# Patient Record
Sex: Female | Born: 1990 | Race: White | Hispanic: No | Marital: Single | Smoking: Never smoker
Health system: Southern US, Community
[De-identification: ages and names within clinical notes are randomized; demographics above are authoritative.]

## PROBLEM LIST (undated history)

## (undated) DIAGNOSIS — Z789 Other specified health status: Secondary | ICD-10-CM

## (undated) HISTORY — PX: NO PAST SURGERIES: SHX2092

---

## 2013-06-05 ENCOUNTER — Emergency Department (HOSPITAL_COMMUNITY)
Admission: EM | Admit: 2013-06-05 | Discharge: 2013-06-06 | Disposition: A | Discharge status: Home / Self Care | Payer: 59 | Attending: Emergency Medicine | Admitting: Emergency Medicine

## 2013-06-05 ENCOUNTER — Encounter (HOSPITAL_COMMUNITY): Payer: Self-pay | Admitting: Emergency Medicine

## 2013-06-05 DIAGNOSIS — F4320 Adjustment disorder, unspecified: Secondary | ICD-10-CM

## 2013-06-05 DIAGNOSIS — Z79899 Other long term (current) drug therapy: Secondary | ICD-10-CM | POA: Insufficient documentation

## 2013-06-05 NOTE — ED Notes (Signed)
Pt states she is here because her parents are too strict and dont know how to let their daughter grow up.  Parents states the pt is acting out, leaving home to go live with her boyfriend then coming home and leaving again  Father states he thinks she is unstable and is afraid to leave pt alone.  Pt denies SI/HI.  Mother states she thinks the pt has a chemical imbalance and needs to be checked out.  Pt is agitated and upset about being here.

## 2013-06-06 NOTE — ED Provider Notes (Signed)
CSN: [id number]     Arrival date & time 06/05/13  2336 History     First MD Initiated Contact with Patient 06/06/13 0037     Chief Complaint  Patient presents with  . Medical Clearance   (Consider location/radiation/quality/duration/timing/severity/associated sxs/prior Treatment) HPI 22 yo female presents to the ER accompanied by her parents.  Her parents are concerned about her recent behavior and wish she be evaluated for possible chemical imbalance.  Over the last year patient has been dating a new boyfriend that they do not approve of.  She moved in with him two months ago, recently came back.  They do not wish her to continue to associate with him.  They feel she is no longer herself.  Pt was sad this past weekend (per mother) because the patient saw her boyfriend posting on facebook about going out.  Pt denies SI/HI, depression, delusions, psychosis.  She is upset that her parents are so strict and controlling her life.    History reviewed. No pertinent past medical history. History reviewed. No pertinent past surgical history. Family History  Problem Relation Age of Onset  . Hypertension Other   . Stroke Other   . Depression Other    History  Substance Use Topics  . Smoking status: Never Smoker   . Smokeless tobacco: Not on file  . Alcohol Use: No   OB History   Grav Para Term Preterm Abortions TAB SAB Ect Mult Living                 Review of Systems  All other systems reviewed and are negative.    Allergies  Review of patient's allergies indicates no known allergies.  Home Medications   Current Outpatient Rx  Name  Route  Sig  Dispense  Refill  . vitamin C (ASCORBIC ACID) 500 MG tablet   Oral   Take 500 mg by mouth every morning.          BP 135/79  Pulse 99  Temp(Src) 98.3 F (36.8 C) (Oral)  Resp 20  SpO2 99%  LMP 05/20/2013 Physical Exam  Nursing note and vitals reviewed. Constitutional: She is oriented to person, place, and time. She appears  well-developed and well-nourished.  HENT:  Head: Normocephalic and atraumatic.  Nose: Nose normal.  Mouth/Throat: Oropharynx is clear and moist.  Eyes: Conjunctivae and EOM are normal. Pupils are equal, round, and reactive to light.  Neck: Normal range of motion. Neck supple. No JVD present. No tracheal deviation present. No thyromegaly present.  Cardiovascular: Normal rate, regular rhythm, normal heart sounds and intact distal pulses.  Exam reveals no gallop and no friction rub.   No murmur heard. Pulmonary/Chest: Effort normal and breath sounds normal. No stridor. No respiratory distress. She has no wheezes. She has no rales. She exhibits no tenderness.  Abdominal: Soft. Bowel sounds are normal. She exhibits no distension and no mass. There is no tenderness. There is no rebound and no guarding.  Musculoskeletal: Normal range of motion. She exhibits no edema and no tenderness.  Lymphadenopathy:    She has no cervical adenopathy.  Neurological: She is alert and oriented to person, place, and time. She has normal reflexes. No cranial nerve deficit. She exhibits normal muscle tone. Coordination normal.  Skin: Skin is warm and dry. No rash noted. No erythema. No pallor.  Psychiatric: She has a normal mood and affect. Her behavior is normal. Judgment and thought content normal.    ED Course   Procedures (including  critical care time)  Labs Reviewed - No data to display No results found. 1. Adjustment disorder     MDM  22 yo female with possible adjustment disorder vs just going though normal life changed and establishing independence.  Pt and family recommended family counseling.  Pt does not meet criteria for admission.  Olivia Mackie, MD 06/06/13 [phone]

## 2013-10-06 ENCOUNTER — Inpatient Hospital Stay (HOSPITAL_COMMUNITY)
Admission: EM | Admit: 2013-10-06 | Discharge: 2013-10-06 | Disposition: A | Discharge status: Other Acute Inpatient Hospital | Payer: 59 | Attending: Emergency Medicine | Admitting: Emergency Medicine

## 2013-10-06 ENCOUNTER — Encounter (HOSPITAL_COMMUNITY): Payer: Self-pay | Admitting: Emergency Medicine

## 2013-10-06 ENCOUNTER — Emergency Department (HOSPITAL_COMMUNITY): Payer: 59

## 2013-10-06 DIAGNOSIS — N83209 Unspecified ovarian cyst, unspecified side: Secondary | ICD-10-CM | POA: Insufficient documentation

## 2013-10-06 DIAGNOSIS — R109 Unspecified abdominal pain: Secondary | ICD-10-CM

## 2013-10-06 DIAGNOSIS — O009 Unspecified ectopic pregnancy without intrauterine pregnancy: Secondary | ICD-10-CM

## 2013-10-06 DIAGNOSIS — N838 Other noninflammatory disorders of ovary, fallopian tube and broad ligament: Secondary | ICD-10-CM

## 2013-10-06 DIAGNOSIS — O9989 Other specified diseases and conditions complicating pregnancy, childbirth and the puerperium: Secondary | ICD-10-CM | POA: Insufficient documentation

## 2013-10-06 DIAGNOSIS — O26899 Other specified pregnancy related conditions, unspecified trimester: Secondary | ICD-10-CM

## 2013-10-06 DIAGNOSIS — R1031 Right lower quadrant pain: Secondary | ICD-10-CM | POA: Insufficient documentation

## 2013-10-06 LAB — LIPASE, BLOOD: Lipase: 18 U/L (ref 11–59)

## 2013-10-06 LAB — COMPREHENSIVE METABOLIC PANEL
Albumin: 4.1 g/dL (ref 3.5–5.2)
BUN: 11 mg/dL (ref 6–23)
Calcium: 9 mg/dL (ref 8.4–10.5)
GFR calc Af Amer: >90 mL/min (ref >90)
Glucose, Bld: 101 mg/dL — ABNORMAL HIGH (ref 70–99)
Sodium: 134 mEq/L — ABNORMAL LOW (ref 135–145)
Total Protein: 7.1 g/dL (ref 6.0–8.3)

## 2013-10-06 LAB — URINALYSIS, ROUTINE W REFLEX MICROSCOPIC
Glucose, UA: NEGATIVE mg/dL
Ketones, ur: NEGATIVE mg/dL
Leukocytes, UA: NEGATIVE
Protein, ur: NEGATIVE mg/dL
Urobilinogen, UA: 1 mg/dL (ref 0.0–1.0)

## 2013-10-06 LAB — CBC
Hemoglobin: 12.7 g/dL (ref 12.0–15.0)
MCH: 29.3 pg (ref 26.0–34.0)
MCHC: 33.8 g/dL (ref 30.0–36.0)
RDW: 12.5 % (ref 11.5–15.5)

## 2013-10-06 LAB — ABO/RH: ABO/RH(D): O POS

## 2013-10-06 MED ORDER — MORPHINE SULFATE 4 MG/ML IJ SOLN
4.0000 mg | Freq: Once | INTRAMUSCULAR | Status: AC
  Administered 2013-10-06: 4 mg via INTRAVENOUS
  Filled 2013-10-06: qty 1

## 2013-10-06 MED ORDER — SODIUM CHLORIDE 0.9 % IV BOLUS (SEPSIS)
1000.0000 mL | Freq: Once | INTRAVENOUS | Status: AC
  Administered 2013-10-06: 1000 mL via INTRAVENOUS

## 2013-10-06 MED ORDER — ONDANSETRON HCL 4 MG/2ML IJ SOLN
4.0000 mg | Freq: Once | INTRAMUSCULAR | Status: AC
  Administered 2013-10-06: 4 mg via INTRAVENOUS
  Filled 2013-10-06: qty 2

## 2013-10-06 MED ORDER — METHOTREXATE INJECTION FOR WOMEN'S HOSPITAL
50.0000 mg/m2 | Freq: Once | INTRAMUSCULAR | Status: AC
  Administered 2013-10-06: 70 mg via INTRAMUSCULAR
  Filled 2013-10-06: qty 1.4

## 2013-10-06 NOTE — ED Notes (Signed)
Pt from home reports that she started to have RLQ abd pain x2 days. Pt reports that she had "dry heaves" yesterday, but not today. Pt denies any other c/o. Pt is A&O and in NAD. Pt having difficulty standing erect to walk.

## 2013-10-06 NOTE — MAU Note (Signed)
Pt presents to [facility] complaining of right lower quadrant abdominal pain. Complains of some spotting x2 weeks following her normal period.

## 2013-10-06 NOTE — ED Provider Notes (Signed)
CSN: [id number]     Arrival date & time 10/06/13  4098 History   First MD Initiated Contact with Patient 10/06/13 1045     Chief Complaint  Patient presents with  . Abdominal Pain   (Consider location/radiation/quality/duration/timing/severity/associated sxs/prior Treatment) HPI Pt presenting with c/o right sided pelvic pain.  Pt states that pain began yesterday and was relieved after eating food and taking ibuprofen.  This morning she states pain had recurred, described as sharp and constant.  She took ibuprofen this morning again and feels that it is helping.  She feels that when she stands up she is not able to stand up straight but feels better bending forward.  She also describes feeling lightheaded yesterday and having dry heaves.  Has been able to keep down food this morning.  LMP was 2 weeks ago and normal per patient.  No dysuria, no vaginal discharge or vaginal bleeding.  There are no other associated systemic symptoms, there are no other alleviating or modifying factors.   History reviewed. No pertinent past medical history. History reviewed. No pertinent past surgical history. Family History  Problem Relation Age of Onset  . Hypertension Other   . Stroke Other   . Depression Other    History  Substance Use Topics  . Smoking status: Never Smoker   . Smokeless tobacco: Not on file  . Alcohol Use: No   OB History   Grav Para Term Preterm Abortions TAB SAB Ect Mult Living                 Review of Systems ROS reviewed and all otherwise negative except for mentioned in HPI  Allergies  Review of patient's allergies indicates no known allergies.  Home Medications   Current Outpatient Rx  Name  Route  Sig  Dispense  Refill  . ibuprofen (ADVIL,MOTRIN) 200 MG tablet   Oral   Take 400 mg by mouth every 6 (six) hours as needed for moderate pain.          BP 133/68  Pulse 92  Temp(Src) 98.2 F (36.8 C) (Oral)  Resp 16  Wt 96 lb (43.545 kg)  SpO2 100%  LMP  09/22/2013 Vitals reviewed Physical Exam Physical Examination: General appearance - alert, well appearing, and in no distress Mental status - alert, oriented to person, place, and time Eyes - no scleral icterus, no conjunctival injection Mouth - mucous membranes moist, pharynx normal without lesions Chest - clear to auscultation, no wheezes, rales or rhonchi, symmetric air entry Heart - normal rate, regular rhythm, normal S1, S2, no murmurs, rubs, clicks or gallops Abdomen - soft, ttp in right lower abdomen, nondistended, no masses or organomegaly Back exam - full range of motion, no tenderness, palpable spasm or pain on motion Extremities - peripheral pulses normal, no pedal edema, no clubbing or cyanosis Skin - normal coloration and turgor, no rashes  ED Course  Procedures (including critical care time)  12:18 PM pt notified of pregnancy result.  Prior to telling her result I asked if she would like to speak in private.  She stated it was all right that mother hear results as well.   1:44 PM discussed ultrasound findings with [facility] gy[facility], pt sees Dr. Rana Snare for yearly gyn checkups.  Therefore paging on call for Dr. Rana Snare now.   1:57 PM I have updated patient about findings and will let her know when I hear back from gyn about plan.   2:31 PM call back from gyn, however  she is in a delivery and will call back.   2:48 PM d/w Dr. Vincente Poli, gynecology.  Pt to be transferred to MAU for evaluation.   3:16 PM pelvic set up but not done prior to transfer- will defer to womens- based on ultrasound results there is no IUP, pt denies having any vaginal bleeding.  Emergent pelvic exam would have little bearing on plan which is to transfer to [facility] due to ultrasound findings.  Labs Review Labs Reviewed  URINALYSIS, ROUTINE W REFLEX MICROSCOPIC - Abnormal; Notable for the following:    Specific Gravity, Urine 1.035 (*)    All other components within normal limits  PREGNANCY, URINE -  Abnormal; Notable for the following:    Preg Test, Ur POSITIVE (*)    All other components within normal limits  CBC - Abnormal; Notable for the following:    WBC 11.3 (*)    All other components within normal limits  COMPREHENSIVE METABOLIC PANEL - Abnormal; Notable for the following:    Sodium 134 (*)    Glucose, Bld 101 (*)    All other components within normal limits  HCG, QUANTITATIVE, PREGNANCY - Abnormal; Notable for the following:    hCG, Beta Chain, Quant, S 2901 (*)    All other components within normal limits  GC/CHLAMYDIA PROBE AMP  WET PREP, GENITAL  LIPASE, BLOOD   Imaging Review US Ob Comp Less 14 Wks  10/06/2013   CLINICAL DATA:  Abdominal pain and spotting  EXAM: OBSTETRIC <14 WK [place] AND TRANSVAGINAL OB US  TECHNIQUE: Both transabdominal and transvaginal ultrasound examinations were performed for complete evaluation of the gestation as well as the maternal uterus, adnexal regions, and pelvic cul-de-sac. Transvaginal technique was performed to assess early pregnancy.  COMPARISON:  None.  FINDINGS: Intrauterine gestational sac: No intrauterine gestational sac  Yolk sac:  Not applicable  Embryo:  Not applicable  Cardiac Activity: Not applicable  Heart Rate:  Not applicable bpm  Maternal uterus/adnexae:  Subchorionic hemorrhage: Not applicable  Right ovary: Contains a corpus luteum and a simple appearing cyst. Adjacent and separate from the right ovary is a complex mass measuring 2.6 x 1.4 x 2.1 cm. No significant hyperemia identified within this mass.  Left ovary: Normal  Other :None  Free fluid: A trace amount of free fluid is noted within the cul-de-sac.  IMPRESSION: 1. There is no intrauterine gestation identified 2. Complex mass is identified within the right adnexa. This appears adjacent to and separate from the right ovary. Cannot rule out ectopic pregnancy. Critical Value/emergent results were called by telephone at the time of interpretation on is at 1:31 PM to [facility] , who verbally acknowledged these results.   Electronically Signed   By: Signa Kell M.D.   On: 10/06/2013 13:31   US Ob Transvaginal  10/06/2013   CLINICAL DATA:  Abdominal pain and spotting  EXAM: OBSTETRIC <14 WK [place] AND TRANSVAGINAL OB US  TECHNIQUE: Both transabdominal and transvaginal ultrasound examinations were performed for complete evaluation of the gestation as well as the maternal uterus, adnexal regions, and pelvic cul-de-sac. Transvaginal technique was performed to assess early pregnancy.  COMPARISON:  None.  FINDINGS: Intrauterine gestational sac: No intrauterine gestational sac  Yolk sac:  Not applicable  Embryo:  Not applicable  Cardiac Activity: Not applicable  Heart Rate:  Not applicable bpm  Maternal uterus/adnexae:  Subchorionic hemorrhage: Not applicable  Right ovary: Contains a corpus luteum and a simple appearing cyst. Adjacent and separate from the right  ovary is a complex mass measuring 2.6 x 1.4 x 2.1 cm. No significant hyperemia identified within this mass.  Left ovary: Normal  Other :None  Free fluid: A trace amount of free fluid is noted within the cul-de-sac.  IMPRESSION: 1. There is no intrauterine gestation identified 2. Complex mass is identified within the right adnexa. This appears adjacent to and separate from the right ovary. Cannot rule out ectopic pregnancy. Critical Value/emergent results were called by telephone at the time of interpretation on is at 1:31 PM to [facility] , who verbally acknowledged these results.   Electronically Signed   By: Signa Kell M.D.   On: 10/06/2013 13:31    EKG Interpretation   None       MDM   1. Abdominal pain complicating pregnancy   2. Ovarian mass    Pt presenting with c/o right lower abdominal pain.  She had positive pregnancy test in the ED today.  LMP 2 weeks ago, quant 2900, no IUP on ultrasound.  Does have some mass in right adnexa.  Pt to be transferred to MAU for evaluation by gynecology.  Vital  signs are stable.     Ethelda Chick, MD 10/06/13 [phone]

## 2013-10-06 NOTE — ED Notes (Signed)
Patient transported to Ultrasound 

## 2013-10-06 NOTE — MAU Provider Note (Signed)
History     CSN: [id number]  Arrival date and time: 10/06/13 0947   None     Chief Complaint  Patient presents with  . Abdominal Pain   HPI Pt is transferred from [facility] ED after she was seen for sudden onset of RLQ pain.  Pt was found to be pregnant with ultrsound Showing right adnexal mass and no intrauterine gestational sac. Pt was given Morphine in [facility] ED and pt's pain level is 2/10 at  This time.   Mother is with pt and explains that this was not a good situation- pt has not had a good relationship with parents and has been living with boyfriend, Using condoms for contraception.  Pt has been estranged from family for 5 months.   Pt's mother is requesting recommendation for counseling. MAU Note Service date: 10/06/2013 4:37 PM   Pt presents to [facility] complaining of right lower quadrant abdominal pain. Complains of some spotting x2 weeks following her normal period     History reviewed. No pertinent past medical history.  History reviewed. No pertinent past surgical history.  Family History  Problem Relation Age of Onset  . Hypertension Other   . Stroke Other   . Depression Other     History  Substance Use Topics  . Smoking status: Never Smoker   . Smokeless tobacco: Not on file  . Alcohol Use: No    Allergies: No Known Allergies  Prescriptions prior to admission  Medication Sig Dispense Refill  . ibuprofen (ADVIL,MOTRIN) 200 MG tablet Take 400 mg by mouth every 6 (six) hours as needed for moderate pain.        Review of Systems  Constitutional: Negative for fever and chills.  Gastrointestinal: Positive for abdominal pain. Negative for nausea and vomiting.  Genitourinary: Negative for dysuria.   Physical Exam   Blood pressure 137/68, pulse 97, temperature 98.6 F (37 C), temperature source Oral, resp. rate 18, weight 43.545 kg (96 lb), last menstrual period 09/22/2013, SpO2 100.00%.  Physical Exam  Nursing note and vitals  reviewed. Constitutional: She is oriented to person, place, and time. She appears well-developed and well-nourished.  tearful  HENT:  Head: Normocephalic.  Eyes: Pupils are equal, round, and reactive to light.  Neck: Normal range of motion. Neck supple.  Cardiovascular: Normal rate.   Respiratory: Effort normal.  GI: Soft. She exhibits no distension. There is tenderness. There is no rebound and no guarding.  Musculoskeletal: Normal range of motion.  Neurological: She is alert and oriented to person, place, and time.  Skin: Skin is warm and dry.  Psychiatric: She has a normal mood and affect.    MAU Course  Procedures Results for orders placed during the hospital encounter of 10/06/13 (from the past 24 hour(s))  URINALYSIS, ROUTINE W REFLEX MICROSCOPIC     Status: Abnormal   Collection Time    10/06/13 10:50 AM      Result Value Range   Color, Urine YELLOW  YELLOW   APPearance CLEAR  CLEAR   Specific Gravity, Urine 1.035 (*) 1.005 - 1.030   pH 6.0  5.0 - 8.0   Glucose, UA NEGATIVE  NEGATIVE mg/dL   Hgb urine dipstick NEGATIVE  NEGATIVE   Bilirubin Urine NEGATIVE  NEGATIVE   Ketones, ur NEGATIVE  NEGATIVE mg/dL   Protein, ur NEGATIVE  NEGATIVE mg/dL   Urobilinogen, UA 1.0  0.0 - 1.0 mg/dL   Nitrite NEGATIVE  NEGATIVE   Leukocytes, UA NEGATIVE  NEGATIVE  PREGNANCY, URINE     Status: Abnormal   Collection Time    10/06/13 10:50 AM      Result Value Range   Preg Test, Ur POSITIVE (*) NEGATIVE  CBC     Status: Abnormal   Collection Time    10/06/13 11:20 AM      Result Value Range   WBC 11.3 (*) 4.0 - 10.5 K/uL   RBC 4.33  3.87 - 5.11 MIL/uL   Hemoglobin 12.7  12.0 - 15.0 g/dL   HCT 16.1  09.6 - 04.5 %   MCV 86.8  78.0 - 100.0 fL   MCH 29.3  26.0 - 34.0 pg   MCHC 33.8  30.0 - 36.0 g/dL   RDW 40.9  81.1 - 91.4 %   Platelets 310  150 - 400 K/uL  COMPREHENSIVE METABOLIC PANEL     Status: Abnormal   Collection Time    10/06/13 11:20 AM      Result Value Range   Sodium  134 (*) 135 - 145 mEq/L   Potassium 3.6  3.5 - 5.1 mEq/L   Chloride 105  96 - 112 mEq/L   CO2 22  19 - 32 mEq/L   Glucose, Bld 101 (*) 70 - 99 mg/dL   BUN 11  6 - 23 mg/dL   Creatinine, Ser 7.82  0.50 - 1.10 mg/dL   Calcium 9.0  8.4 - 95.6 mg/dL   Total Protein 7.1  6.0 - 8.3 g/dL   Albumin 4.1  3.5 - 5.2 g/dL   AST 16  0 - 37 U/L   ALT 10  0 - 35 U/L   Alkaline Phosphatase 66  39 - 117 U/L   Total Bilirubin 0.4  0.3 - 1.2 mg/dL   GFR calc non Af Amer >90  >90 mL/min   GFR calc Af Amer >90  >90 mL/min  LIPASE, BLOOD     Status: None   Collection Time    10/06/13 11:20 AM      Result Value Range   Lipase 18  11 - 59 U/L  HCG, QUANTITATIVE, PREGNANCY     Status: Abnormal   Collection Time    10/06/13 11:37 AM      Result Value Range   hCG, Beta Chain, Quant, S 2901 (*) <5 mIU/mL  ABO/RH     Status: None   Collection Time    10/06/13  5:05 PM      Result Value Range   ABO/RH(D) O POS     Results for orders placed during the hospital encounter of 10/06/13 (from the past 24 hour(s))  URINALYSIS, ROUTINE W REFLEX MICROSCOPIC     Status: Abnormal   Collection Time    10/06/13 10:50 AM      Result Value Range   Color, Urine YELLOW  YELLOW   APPearance CLEAR  CLEAR   Specific Gravity, Urine 1.035 (*) 1.005 - 1.030   pH 6.0  5.0 - 8.0   Glucose, UA NEGATIVE  NEGATIVE mg/dL   Hgb urine dipstick NEGATIVE  NEGATIVE   Bilirubin Urine NEGATIVE  NEGATIVE   Ketones, ur NEGATIVE  NEGATIVE mg/dL   Protein, ur NEGATIVE  NEGATIVE mg/dL   Urobilinogen, UA 1.0  0.0 - 1.0 mg/dL   Nitrite NEGATIVE  NEGATIVE   Leukocytes, UA NEGATIVE  NEGATIVE  PREGNANCY, URINE     Status: Abnormal   Collection Time    10/06/13 10:50 AM      Result Value Range  Preg Test, Ur POSITIVE (*) NEGATIVE  CBC     Status: Abnormal   Collection Time    10/06/13 11:20 AM      Result Value Range   WBC 11.3 (*) 4.0 - 10.5 K/uL   RBC 4.33  3.87 - 5.11 MIL/uL   Hemoglobin 12.7  12.0 - 15.0 g/dL   HCT 95.6  21.3  - 08.6 %   MCV 86.8  78.0 - 100.0 fL   MCH 29.3  26.0 - 34.0 pg   MCHC 33.8  30.0 - 36.0 g/dL   RDW 57.8  46.9 - 62.9 %   Platelets 310  150 - 400 K/uL  COMPREHENSIVE METABOLIC PANEL     Status: Abnormal   Collection Time    10/06/13 11:20 AM      Result Value Range   Sodium 134 (*) 135 - 145 mEq/L   Potassium 3.6  3.5 - 5.1 mEq/L   Chloride 105  96 - 112 mEq/L   CO2 22  19 - 32 mEq/L   Glucose, Bld 101 (*) 70 - 99 mg/dL   BUN 11  6 - 23 mg/dL   Creatinine, Ser 5.28  0.50 - 1.10 mg/dL   Calcium 9.0  8.4 - 41.3 mg/dL   Total Protein 7.1  6.0 - 8.3 g/dL   Albumin 4.1  3.5 - 5.2 g/dL   AST 16  0 - 37 U/L   ALT 10  0 - 35 U/L   Alkaline Phosphatase 66  39 - 117 U/L   Total Bilirubin 0.4  0.3 - 1.2 mg/dL   GFR calc non Af Amer >90  >90 mL/min   GFR calc Af Amer >90  >90 mL/min  LIPASE, BLOOD     Status: None   Collection Time    10/06/13 11:20 AM      Result Value Range   Lipase 18  11 - 59 U/L  HCG, QUANTITATIVE, PREGNANCY     Status: Abnormal   Collection Time    10/06/13 11:37 AM      Result Value Range   hCG, Beta Chain, Quant, S 2901 (*) <5 mIU/mL  US Ob Comp Less 14 Wks  10/06/2013   CLINICAL DATA:  Abdominal pain and spotting  EXAM: OBSTETRIC <14 WK [place] AND TRANSVAGINAL OB US  TECHNIQUE: Both transabdominal and transvaginal ultrasound examinations were performed for complete evaluation of the gestation as well as the maternal uterus, adnexal regions, and pelvic cul-de-sac. Transvaginal technique was performed to assess early pregnancy.  COMPARISON:  None.  FINDINGS: Intrauterine gestational sac: No intrauterine gestational sac  Yolk sac:  Not applicable  Embryo:  Not applicable  Cardiac Activity: Not applicable  Heart Rate:  Not applicable bpm  Maternal uterus/adnexae:  Subchorionic hemorrhage: Not applicable  Right ovary: Contains a corpus luteum and a simple appearing cyst. Adjacent and separate from the right ovary is a complex mass measuring 2.6 x 1.4 x 2.1 cm. No  significant hyperemia identified within this mass.  Left ovary: Normal  Other :None  Free fluid: A trace amount of free fluid is noted within the cul-de-sac.  IMPRESSION: 1. There is no intrauterine gestation identified 2. Complex mass is identified within the right adnexa. This appears adjacent to and separate from the right ovary. Cannot rule out ectopic pregnancy. Critical Value/emergent results were called by telephone at the time of interpretation on is at 1:31 PM to [facility] , who verbally acknowledged these results.   Electronically Signed  By: Signa Kell M.D.   On: 10/06/2013 13:31   US Ob Transvaginal  10/06/2013   CLINICAL DATA:  Abdominal pain and spotting  EXAM: OBSTETRIC <14 WK [place] AND TRANSVAGINAL OB US  TECHNIQUE: Both transabdominal and transvaginal ultrasound examinations were performed for complete evaluation of the gestation as well as the maternal uterus, adnexal regions, and pelvic cul-de-sac. Transvaginal technique was performed to assess early pregnancy.  COMPARISON:  None.  FINDINGS: Intrauterine gestational sac: No intrauterine gestational sac  Yolk sac:  Not applicable  Embryo:  Not applicable  Cardiac Activity: Not applicable  Heart Rate:  Not applicable bpm  Maternal uterus/adnexae:  Subchorionic hemorrhage: Not applicable  Right ovary: Contains a corpus luteum and a simple appearing cyst. Adjacent and separate from the right ovary is a complex mass measuring 2.6 x 1.4 x 2.1 cm. No significant hyperemia identified within this mass.  Left ovary: Normal  Other :None  Free fluid: A trace amount of free fluid is noted within the cul-de-sac.  IMPRESSION: 1. There is no intrauterine gestation identified 2. Complex mass is identified within the right adnexa. This appears adjacent to and separate from the right ovary. Cannot rule out ectopic pregnancy. Critical Value/emergent results were called by telephone at the time of interpretation on is at 1:31 PM to [facility] , who  verbally acknowledged these results.   Electronically Signed   By: Signa Kell M.D.   On: 10/06/2013 13:31   Dr. Vincente Poli called and is aware of pt's findings. Dr. Vincente Poli came to see pt and discussed options with pt Pt given MTX- discussed importance of follow up on Tuesday with Dr. Vance Gather office for HCG Pt to return to MAU ASAP if has sudden onset of abd pain     Assessment and Plan  Ectopic pregnancy with MTX- f/u Day #4 with Dr. Vance Gather office for repeat HCG Counseling recommended for pt and also parents Arbutus Ped and Leandra Kern names given to pt- pt to call Dr. Vance Gather office for other suggestions if needed  [facility] 10/06/2013, 4:54 PM

## 2014-07-16 ENCOUNTER — Ambulatory Visit (INDEPENDENT_AMBULATORY_CARE_PROVIDER_SITE_OTHER): Payer: 59 | Admitting: Family Medicine

## 2014-07-16 ENCOUNTER — Encounter: Payer: Self-pay | Admitting: Family Medicine

## 2014-07-16 VITALS — BP 84/56 | HR 80 | Temp 97.6°F | Resp 14 | Ht 63.0 in | Wt 91.0 lb

## 2014-07-16 DIAGNOSIS — F329 Major depressive disorder, single episode, unspecified: Secondary | ICD-10-CM

## 2014-07-16 DIAGNOSIS — F32A Depression, unspecified: Secondary | ICD-10-CM

## 2014-07-16 DIAGNOSIS — F3289 Other specified depressive episodes: Secondary | ICD-10-CM

## 2014-07-16 MED ORDER — ALPRAZOLAM 0.25 MG PO TABS: 0.2500 mg | ORAL_TABLET | Freq: Two times a day (BID) | ORAL | Status: DC | PRN

## 2014-07-16 MED ORDER — ESCITALOPRAM OXALATE 10 MG PO TABS: 10.0000 mg | ORAL_TABLET | Freq: Every day | ORAL | Status: DC

## 2014-07-16 NOTE — Progress Notes (Signed)
Subjective:    Patient ID: Andrea Torres, female    DOB: October 15, 1991, 23 y.o.   MRN: [id number]  HPI Patient is here today due to anxiety.  There is a very complicated social history. Approximately one year ago, the patient began dating a man who her parents disapproved of. The patient dropped out of college and began working in a [employer]. She moved in with her boyfriend against her parents' wishes. This created tremendous stress in her life as she previously had a very good relationship with her parents. However she feels that her parents are overly controlling. In December of last year, she went to the hospital complaining of severe abdominal pain and was diagnosed with an ectopic pregnancy. This was treated. However this created further stress in her life as her parents disapproved of her relationship, the disapproved with the [place], the disapproved of her behavior and her life choices. This was a strained relationship. They tried to go to family counseling but this did not help. After the ectopic pregnancy and with a further deterioration in the relationship with her parents, the patient began dealing with severe depression last winter.  She reports no energy. She has frequent crying spells which are uncontrollable. She has difficulty sleeping. She has occasional anxiety attacks. She has no appetite. She is losing significant weight. She states that she and she she feels full and nauseated.  To complicate matters, her boyfriend asked for space last spring and then completely broke up with her approximately one month ago.  This has caused depression cysts bilateral controlled. She is moved back in with her parents. However she is afraid to be home alone. Sometimes she cannot stop crying. She denies any suicidal ideation. She denies any mania. She denies any history of severe irritability, euphoria, vomiting, flights of ideas, delusions or grandiosity. There is no family history of  bipolar disorder. There is no family history of underlying psychiatric illness. Patient denies any recreational drug use.  No past medical history on file. No past surgical history on file. No current outpatient prescriptions on file prior to visit.   No current facility-administered medications on file prior to visit.   No Known Allergies History   Social History  . Marital Status: Single    Spouse Name: N/A    Number of Children: N/A  . Years of Education: N/A   Occupational History  . Not on file.   Social History Main Topics  . Smoking status: Never Smoker   . Smokeless tobacco: Never Used  . Alcohol Use: No  . Drug Use: No  . Sexual Activity: Yes    Birth Control/ Protection: Condom     Comment: single   Other Topics Concern  . Not on file   Social History Narrative  . No narrative on file   Family History  Problem Relation Age of Onset  . Hypertension Other   . Stroke Other   . Depression Other   . Hypertension Father      Review of Systems  All other systems reviewed and are negative.      Objective:   Physical Exam  Vitals reviewed. Cardiovascular: Normal rate, regular rhythm and normal heart sounds.   Psychiatric: Her behavior is normal. Judgment and thought content normal. Her speech is not rapid and/or pressured, not delayed, not tangential and not slurred. Cognition and memory are normal. She exhibits a depressed mood. She is communicative.          Assessment &  Plan:  Depression - Plan: escitalopram (LEXAPRO) 10 MG tablet  I feel the patient developed clinical depression shortly after the ectopic pregnancy.  The patient feels as if she has lost a child. This coupled with a severe strain in her relationship with her parents and the recent dissolution of her relationship with her boyfriend has exacerbated her depression. I would like to start the patient on Lexapro 10 mg by mouth daily. I gave the patient Xanax 0.25 mg by mouth every 12 hours  when necessary for panic attacks. I gave the patient 10 tablet. I contracted the patient for safety. She denies any suicidal ideation or homicidal ideation. I would like to see the patient back for recheck in 3 weeks. Have also referred the patient to psychiatrist. She is scheduled to see Dr. Tomasa Rand in 3-4 weeks.  I will also keep bipolar disorder in the differential diagnosis but at the present time I see no past medical history of mania to suggest this as potential cause.

## 2014-09-02 ENCOUNTER — Encounter: Payer: Self-pay | Admitting: Family Medicine

## 2015-01-24 IMAGING — US US OB COMP LESS 14 WK
1 series · 13 of 28 positions shown · non-contrast
Comparison: None.

CLINICAL DATA: Abdominal pain and spotting

EXAM:
OBSTETRIC <14 WK US AND TRANSVAGINAL OB US
TECHNIQUE: Both transabdominal and transvaginal ultrasound examinations were
performed for complete evaluation of the gestation as well as the
maternal uterus, adnexal regions, and pelvic cul-de-sac.
Transvaginal technique was performed to assess early pregnancy.

[Series 1: us ob comp less 14 wk · 0.12mm/px · 13 of 64 slices shown]
[im 3/64]
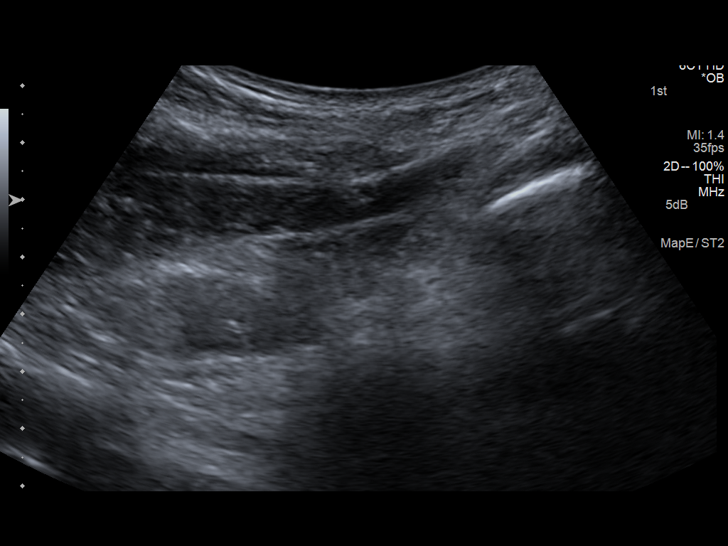
[im 8/64]
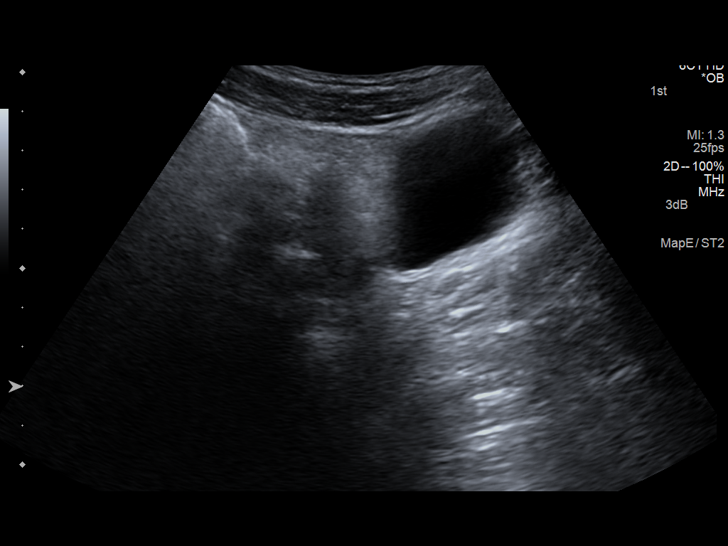
[im 12/64]
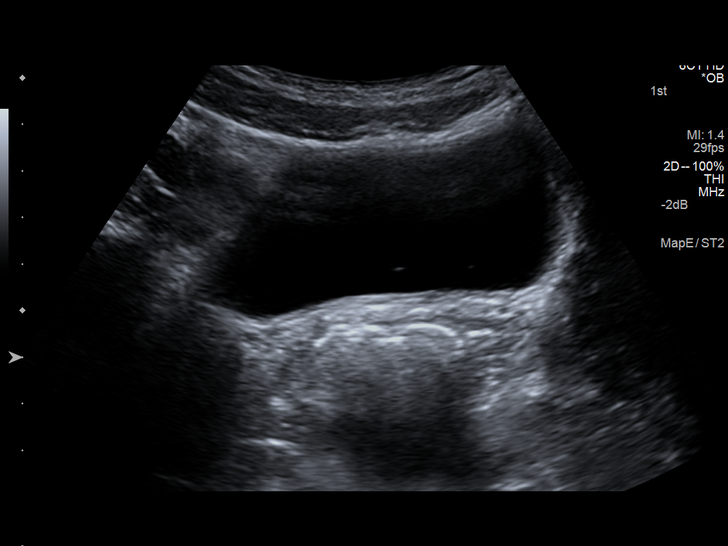
[im 17/64]
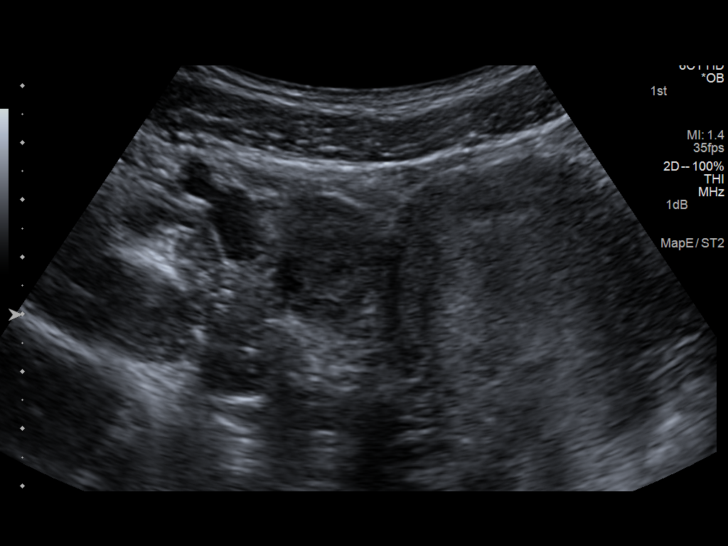
[im 22/64]
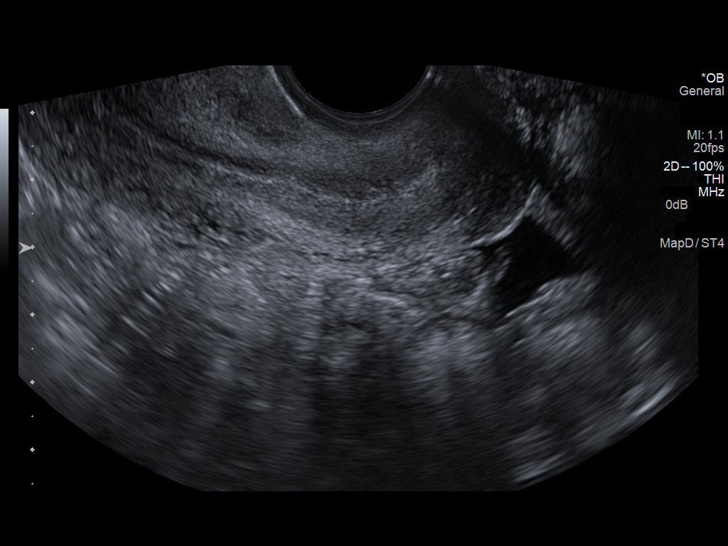
[im 26/64]
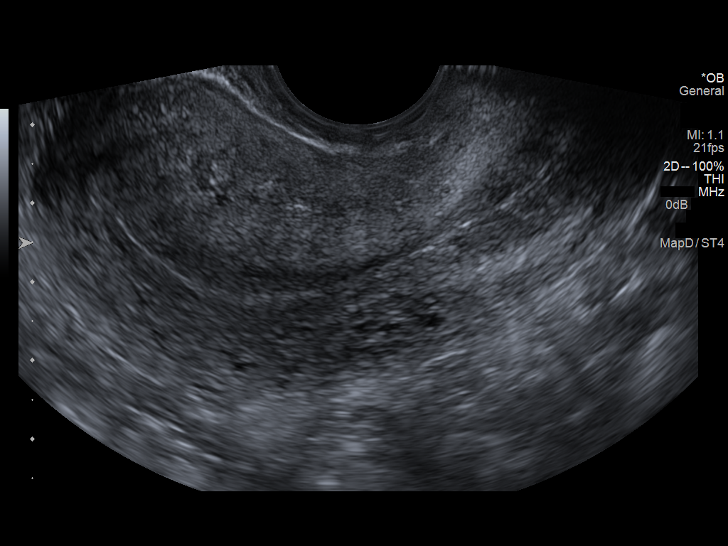
[im 33/64]
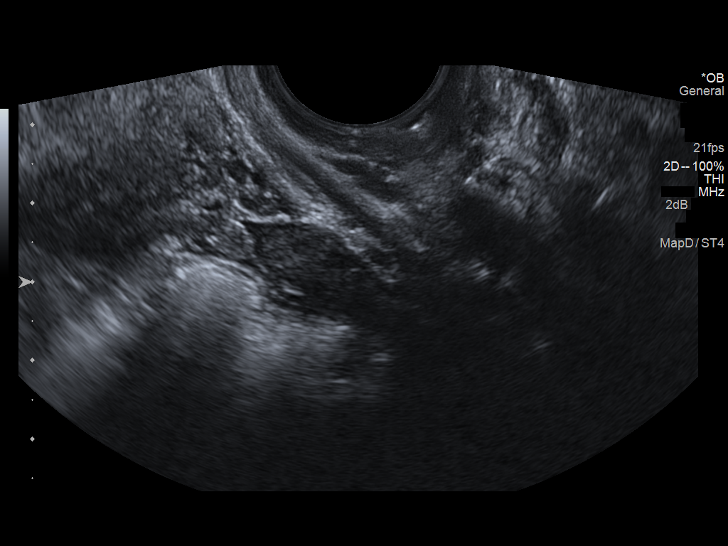
[im 38/64]
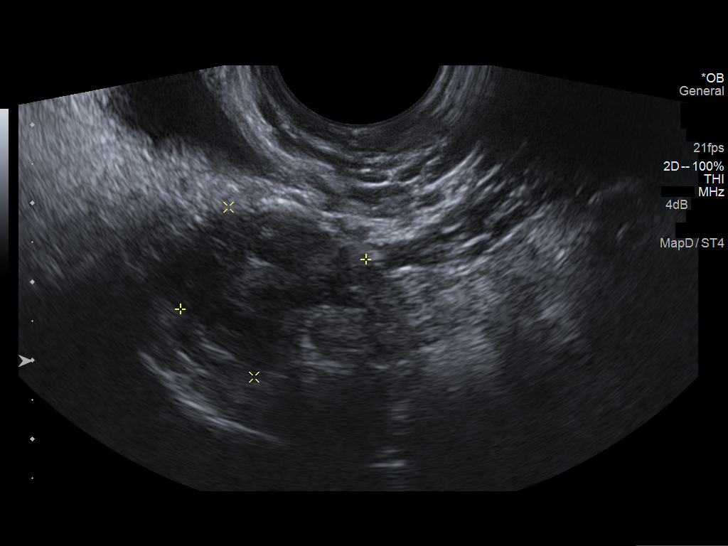
[im 43/64]
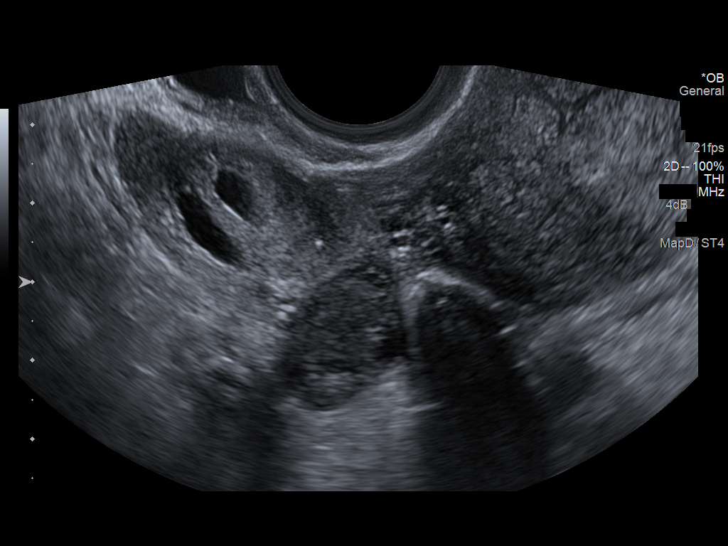
[im 47/64]
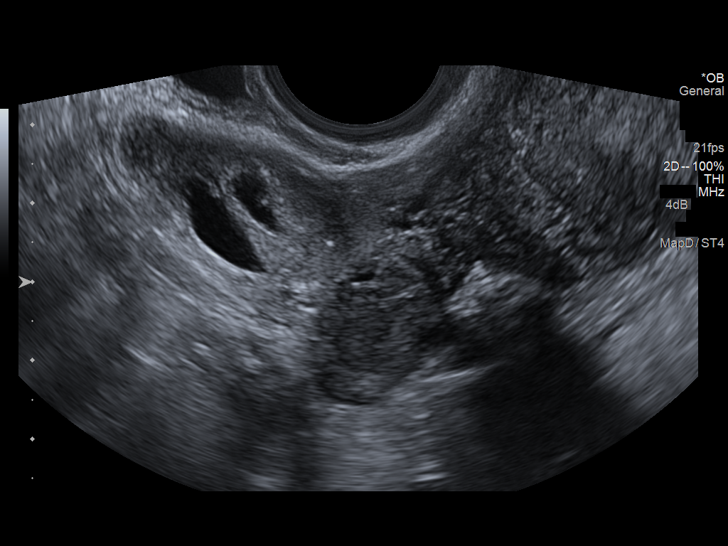
[im 52/64]
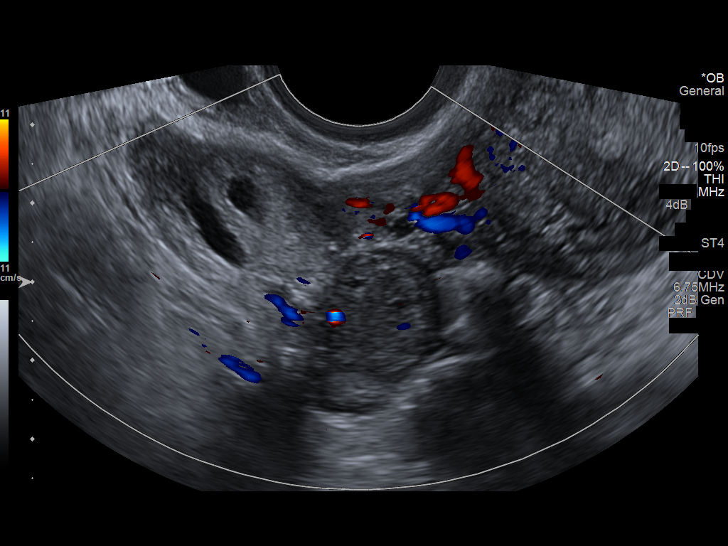
[im 57/64]
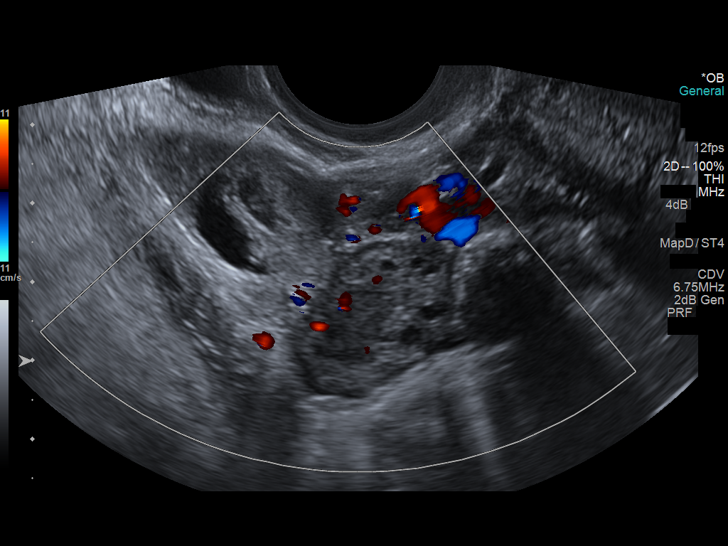
[im 61/64]
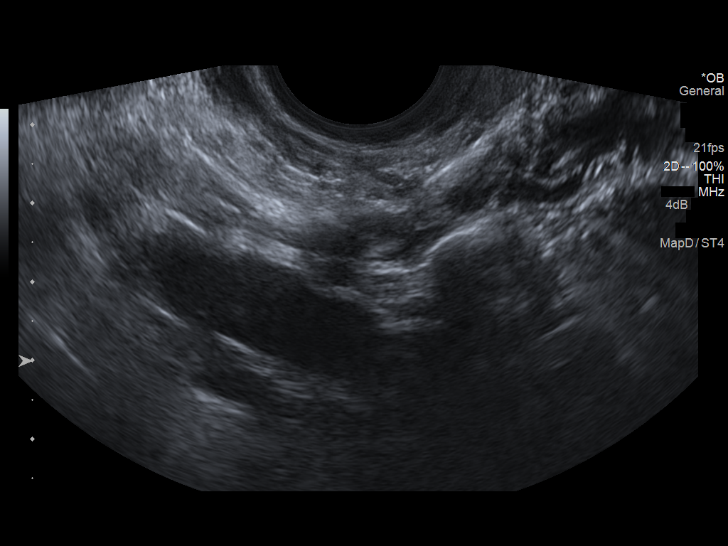

[13 of 28 positions shown; findings below may reference images not displayed]

FINDINGS: Intrauterine gestational sac: No intrauterine gestational sac

Yolk sac:  Not applicable

Embryo:  Not applicable

Cardiac Activity: Not applicable

Heart Rate:  Not applicable bpm

Maternal uterus/adnexae:

Subchorionic hemorrhage: Not applicable

Right ovary: Contains a corpus luteum and a simple appearing cyst.
Adjacent and separate from the right ovary is a complex mass
measuring 2.6 x 1.4 x 2.1 cm. No significant hyperemia identified
within this mass.

Left ovary: Normal

Other :None

Free fluid: A trace amount of free fluid is noted within the
cul-de-sac.
IMPRESSION: 1. There is no intrauterine gestation identified
2. Complex mass is identified within the right adnexa. This appears
adjacent to and separate from the right ovary. Cannot rule out
ectopic pregnancy.
Critical Value/emergent results were called by telephone at the time
of interpretation on is at [DATE] to Dr.MRAVLJAK BARBUTOVSKI , who
verbally acknowledged these results.

## 2015-10-23 ENCOUNTER — Emergency Department (HOSPITAL_COMMUNITY)
Admission: EM | Admit: 2015-10-23 | Discharge: 2015-10-23 | Disposition: A | Discharge status: Home / Self Care | Payer: 59 | Source: Home / Self Care | Attending: Family Medicine | Admitting: Family Medicine

## 2015-10-23 ENCOUNTER — Encounter (HOSPITAL_COMMUNITY): Payer: Self-pay | Admitting: Emergency Medicine

## 2015-10-23 DIAGNOSIS — J069 Acute upper respiratory infection, unspecified: Secondary | ICD-10-CM | POA: Diagnosis not present

## 2015-10-23 LAB — POCT RAPID STREP A: Streptococcus, Group A Screen (Direct): NEGATIVE

## 2015-10-23 MED ORDER — GUAIFENESIN ER 600 MG PO TB12: 1200.0000 mg | ORAL_TABLET | Freq: Two times a day (BID) | ORAL | Status: DC

## 2015-10-23 NOTE — Discharge Instructions (Signed)
It was a pleasure to see you today.  I believe your symptoms are caused by a viral respiratory infection.   I recommend the following:   1. Guaifenesin 600mg  tablets, take 1-2 tablets by mouth every 12 hours to thin mucus.   2. Nasal saline spray (over the counter) in both nostils frequently throughout the day.    3. Vaporizer or sitting in steam-filled bathroom with the hot shower running, to humidify your nose and help you feel better.   4. Keep hydrated with plenty of fluids; hot soups, teas with honey.  5. Ibuprofen 600mg  or Tylenol 650mg  by mouth every 6 hours as needed for achiness.   Follow up if you experience fevers/chills, purulence nasal discharge with facial pain, or other concerns.

## 2015-10-23 NOTE — ED Provider Notes (Signed)
CSN: [id number]     Arrival date & time 10/23/15  1659 History   First MD Initiated Contact with Patient 10/23/15 1720     Chief Complaint  Patient presents with  . URI   (Consider location/radiation/quality/duration/timing/severity/associated sxs/prior Treatment) Patient is a 24 y.o. female presenting with URI. The history is provided by the patient. No language interpreter was used.  URI Presenting symptoms: congestion, cough, ear pain and sore throat   Presenting symptoms: no fever    Patient complains of sore throat, left ear pain, and nasal discharge with hoarseness that started on 10/18/2015 and has worsened gradually since.  She started with sore throat on 12/17, then cough on 12/19; felt ill on 12/20 and stayed in bed all day.  Left ear pain started on 12/20. Works as a Production designer, theatre/television/filmmanager at [employer], may have sick contacts in working with the public. Nonsmoker.  Nyquil, cough drops not helping.   ROS: Denies fevers/chills, no shortness of breath, no nausea/vomiting/diarrhea.  History reviewed. No pertinent past medical history. History reviewed. No pertinent past surgical history. Family History  Problem Relation Age of Onset  . Hypertension Other   . Stroke Other   . Depression Other   . Hypertension Father    Social History  Substance Use Topics  . Smoking status: Never Smoker   . Smokeless tobacco: Never Used  . Alcohol Use: No   OB History    Gravida Para Term Preterm AB TAB SAB Ectopic Multiple Living   1              Review of Systems  Constitutional: Negative for fever, chills and diaphoresis.  HENT: Positive for congestion, ear pain, postnasal drip and sore throat. Negative for ear discharge, nosebleeds, sinus pressure and trouble swallowing.   Respiratory: Positive for cough. Negative for shortness of breath.   Cardiovascular: Negative for chest pain.  Gastrointestinal: Negative for nausea, vomiting and diarrhea.  All other systems reviewed and are  negative.   Allergies  Review of patient's allergies indicates no known allergies.  Home Medications   Prior to Admission medications   Medication Sig Start Date End Date Taking? Authorizing Provider  ALPRAZolam (XANAX) 0.25 MG tablet Take 1 tablet (0.25 mg total) by mouth 2 (two) times daily as needed for anxiety. 07/16/14   Donita BrooksWarren T Pickard, MD  escitalopram (LEXAPRO) 10 MG tablet Take 1 tablet (10 mg total) by mouth daily. 07/16/14   Donita BrooksWarren T Pickard, MD  guaiFENesin (MUCINEX) 600 MG 12 hr tablet Take 2 tablets (1,200 mg total) by mouth 2 (two) times daily. 10/23/15   Barbaraann BarthelJames O Kehinde Totzke, MD   Meds Ordered and Administered this Visit  Medications - No data to display  BP 134/81 mmHg  Pulse 91  Temp(Src) 99 F (37.2 C) (Oral)  Resp 18  SpO2 98%  LMP 09/18/2015 No data found.   Physical Exam  Constitutional: She appears well-developed and well-nourished. No distress.  No apparent distress. Mildly ill appearing with cough and sneezing.   HENT:  Head: Normocephalic and atraumatic.  Right Ear: External ear normal.  Left Ear: External ear normal.  Cobblestoning in oropharynx without exudate. Moist mucus membranes noted.  No frontal or maxillary sinus tenderness   Eyes: EOM are normal. Pupils are equal, round, and reactive to light. Right eye exhibits no discharge. Left eye exhibits no discharge.  Injected conjunctivae bilaterally  Neck: Normal range of motion. Neck supple. No thyromegaly present.  Cardiovascular: Normal rate, regular rhythm and normal heart sounds.  Pulmonary/Chest: Effort normal and breath sounds normal. No respiratory distress. She has no wheezes. She has no rales. She exhibits no tenderness.  Lymphadenopathy:    She has no cervical adenopathy.  Skin: She is not diaphoretic.    ED Course  Procedures (including critical care time)  Labs Review Labs Reviewed  POCT RAPID STREP A    Imaging Review No results found.   Visual Acuity Review  Right Eye  Distance:   Left Eye Distance:   Bilateral Distance:    Right Eye Near:   Left Eye Near:    Bilateral Near:         MDM   1. Upper respiratory infection, viral    Acute viral respiratory infection; discussed s/sx that should warrant return to PCP or UCC for follow up. Symptomatic treatment.     Barbaraann Barthel, MD 10/23/15 [phone]

## 2015-10-23 NOTE — ED Notes (Signed)
C/o cold sx onset 12/17 associated w/congestion, runny nose, ST, left era pain, prod cough Denies fevers, chills A&O x4... No acute distress.

## 2015-10-25 LAB — CULTURE, GROUP A STREP: STREP A CULTURE: NEGATIVE

## 2016-11-01 NOTE — L&D Delivery Note (Signed)
Delivery Note At 4:12 PM a viable female was delivered via Vaginal, Spontaneous Delivery (Presentation: LOA ;  ).  APGAR: 9, 9; weight pending  Placenta status: routine, .  Cord:  WNL with the following complications: none.  Cord pH: sent  Anesthesia:  CLE Episiotomy:  None Lacerations:  2nd Suture Repair: 2.0 3.0 vicryl rapide Est. Blood Loss (mL):  200cc  Mom to postpartum.  Baby to Couplet care / Skin to Skin. It's a girl - "Andrea Torres"!  Andrea Torres 08/18/2017, 5:04 PM

## 2017-01-27 LAB — OB RESULTS CONSOLE HEPATITIS B SURFACE ANTIGEN: Hepatitis B Surface Ag: NEGATIVE

## 2017-01-27 LAB — OB RESULTS CONSOLE ABO/RH: RH Type: POSITIVE

## 2017-01-27 LAB — OB RESULTS CONSOLE RUBELLA ANTIBODY, IGM: RUBELLA: IMMUNE

## 2017-01-27 LAB — OB RESULTS CONSOLE RPR: RPR: NONREACTIVE

## 2017-01-27 LAB — OB RESULTS CONSOLE ANTIBODY SCREEN: Antibody Screen: NEGATIVE

## 2017-01-27 LAB — OB RESULTS CONSOLE HIV ANTIBODY (ROUTINE TESTING): HIV: NONREACTIVE

## 2017-02-01 LAB — OB RESULTS CONSOLE GC/CHLAMYDIA
Chlamydia: NEGATIVE
GC PROBE AMP, GENITAL: NEGATIVE

## 2017-07-21 LAB — OB RESULTS CONSOLE GBS: GBS: NEGATIVE

## 2017-08-18 ENCOUNTER — Inpatient Hospital Stay (HOSPITAL_COMMUNITY): Payer: 59 | Admitting: Anesthesiology

## 2017-08-18 ENCOUNTER — Encounter (HOSPITAL_COMMUNITY): Payer: Self-pay | Admitting: *Deleted

## 2017-08-18 ENCOUNTER — Inpatient Hospital Stay (HOSPITAL_COMMUNITY)
Admission: AD | Admit: 2017-08-18 | Discharge: 2017-08-20 | DRG: 807 | Disposition: A | Discharge status: Home / Self Care | Payer: 59 | Source: Ambulatory Visit | Attending: Obstetrics and Gynecology | Admitting: Obstetrics and Gynecology

## 2017-08-18 DIAGNOSIS — Z3A39 39 weeks gestation of pregnancy: Secondary | ICD-10-CM

## 2017-08-18 DIAGNOSIS — Z3490 Encounter for supervision of normal pregnancy, unspecified, unspecified trimester: Secondary | ICD-10-CM

## 2017-08-18 DIAGNOSIS — O26893 Other specified pregnancy related conditions, third trimester: Secondary | ICD-10-CM | POA: Diagnosis present

## 2017-08-18 LAB — CBC
HCT: 30.7 % — ABNORMAL LOW (ref 36.0–46.0)
HEMOGLOBIN: 10.2 g/dL — AB (ref 12.0–15.0)
MCH: 27.7 pg (ref 26.0–34.0)
MCHC: 33.2 g/dL (ref 30.0–36.0)
MCV: 83.4 fL (ref 78.0–100.0)
PLATELETS: 260 10*3/uL (ref 150–400)
RBC: 3.68 MIL/uL — AB (ref 3.87–5.11)
RDW: 13.6 % (ref 11.5–15.5)
WBC: 14.2 10*3/uL — ABNORMAL HIGH (ref 4.0–10.5)

## 2017-08-18 LAB — TYPE AND SCREEN
ABO/RH(D): O POS
ANTIBODY SCREEN: NEGATIVE

## 2017-08-18 LAB — RPR: RPR: NONREACTIVE

## 2017-08-18 MED ORDER — COCONUT OIL OIL
1.0000 "application " | TOPICAL_OIL | Status: DC | PRN
  Administered 2017-08-20: 1 via TOPICAL
  Filled 2017-08-18: qty 120

## 2017-08-18 MED ORDER — LACTATED RINGERS IV SOLN
INTRAVENOUS | Status: DC
  Administered 2017-08-18: 125 mL via INTRAVENOUS

## 2017-08-18 MED ORDER — ONDANSETRON HCL 4 MG PO TABS: 4.0000 mg | ORAL_TABLET | ORAL | Status: DC | PRN

## 2017-08-18 MED ORDER — WITCH HAZEL-GLYCERIN EX PADS
1.0000 "application " | MEDICATED_PAD | CUTANEOUS | Status: DC | PRN
  Administered 2017-08-18: 1 via TOPICAL

## 2017-08-18 MED ORDER — EPHEDRINE 5 MG/ML INJ
10.0000 mg | INTRAVENOUS | Status: DC | PRN
Start: 2017-08-18 — End: 2017-08-18

## 2017-08-18 MED ORDER — DIPHENHYDRAMINE HCL 25 MG PO CAPS: 25.0000 mg | ORAL_CAPSULE | Freq: Four times a day (QID) | ORAL | Status: DC | PRN

## 2017-08-18 MED ORDER — LACTATED RINGERS IV SOLN
500.0000 mL | Freq: Once | INTRAVENOUS | Status: AC
  Administered 2017-08-18: 500 mL via INTRAVENOUS

## 2017-08-18 MED ORDER — SIMETHICONE 80 MG PO CHEW: 80.0000 mg | CHEWABLE_TABLET | ORAL | Status: DC | PRN

## 2017-08-18 MED ORDER — DIPHENHYDRAMINE HCL 50 MG/ML IJ SOLN: 12.5000 mg | INTRAMUSCULAR | Status: DC | PRN

## 2017-08-18 MED ORDER — OXYTOCIN BOLUS FROM INFUSION
500.0000 mL | Freq: Once | INTRAVENOUS | Status: AC
  Administered 2017-08-18: 500 mL via INTRAVENOUS

## 2017-08-18 MED ORDER — SENNOSIDES-DOCUSATE SODIUM 8.6-50 MG PO TABS
2.0000 | ORAL_TABLET | ORAL | Status: DC
  Administered 2017-08-18 – 2017-08-20 (×2): 2 via ORAL
  Filled 2017-08-18 (×2): qty 2

## 2017-08-18 MED ORDER — OXYCODONE-ACETAMINOPHEN 5-325 MG PO TABS: 1.0000 | ORAL_TABLET | ORAL | Status: DC | PRN

## 2017-08-18 MED ORDER — EPHEDRINE 5 MG/ML INJ: 10.0000 mg | INTRAVENOUS | Status: DC | PRN

## 2017-08-18 MED ORDER — ONDANSETRON HCL 4 MG/2ML IJ SOLN: 4.0000 mg | INTRAMUSCULAR | Status: DC | PRN

## 2017-08-18 MED ORDER — BENZOCAINE-MENTHOL 20-0.5 % EX AERO
1.0000 "application " | INHALATION_SPRAY | CUTANEOUS | Status: DC | PRN
  Administered 2017-08-18: 1 via TOPICAL
  Filled 2017-08-18: qty 56

## 2017-08-18 MED ORDER — PRENATAL MULTIVITAMIN CH
1.0000 | ORAL_TABLET | Freq: Every day | ORAL | Status: DC
  Administered 2017-08-19 – 2017-08-20 (×2): 1 via ORAL
  Filled 2017-08-18 (×2): qty 1

## 2017-08-18 MED ORDER — DIBUCAINE 1 % RE OINT
1.0000 "application " | TOPICAL_OINTMENT | RECTAL | Status: DC | PRN
  Administered 2017-08-18: 1 via RECTAL
  Filled 2017-08-18: qty 28

## 2017-08-18 MED ORDER — LACTATED RINGERS IV SOLN: 500.0000 mL | Freq: Once | INTRAVENOUS | Status: DC

## 2017-08-18 MED ORDER — ACETAMINOPHEN 325 MG PO TABS: 650.0000 mg | ORAL_TABLET | ORAL | Status: DC | PRN

## 2017-08-18 MED ORDER — OXYCODONE HCL 5 MG PO TABS: 5.0000 mg | ORAL_TABLET | ORAL | Status: DC | PRN

## 2017-08-18 MED ORDER — PHENYLEPHRINE 40 MCG/ML (10ML) SYRINGE FOR IV PUSH (FOR BLOOD PRESSURE SUPPORT): 80.0000 ug | PREFILLED_SYRINGE | INTRAVENOUS | Status: DC | PRN

## 2017-08-18 MED ORDER — TETANUS-DIPHTH-ACELL PERTUSSIS 5-2.5-18.5 LF-MCG/0.5 IM SUSP
0.5000 mL | Freq: Once | INTRAMUSCULAR | Status: DC
  Filled 2017-08-18: qty 0.5

## 2017-08-18 MED ORDER — FENTANYL CITRATE (PF) 100 MCG/2ML IJ SOLN
100.0000 ug | Freq: Once | INTRAMUSCULAR | Status: AC
  Administered 2017-08-18: 100 ug via INTRAVENOUS

## 2017-08-18 MED ORDER — PHENYLEPHRINE 40 MCG/ML (10ML) SYRINGE FOR IV PUSH (FOR BLOOD PRESSURE SUPPORT)
80.0000 ug | PREFILLED_SYRINGE | INTRAVENOUS | Status: DC | PRN
Start: 2017-08-18 — End: 2017-08-18

## 2017-08-18 MED ORDER — LIDOCAINE HCL (PF) 1 % IJ SOLN
30.0000 mL | INTRAMUSCULAR | Status: AC | PRN
  Administered 2017-08-18: 30 mL via SUBCUTANEOUS
  Filled 2017-08-18: qty 30

## 2017-08-18 MED ORDER — FENTANYL 2.5 MCG/ML BUPIVACAINE 1/10 % EPIDURAL INFUSION (WH - ANES)
14.0000 mL/h | INTRAMUSCULAR | Status: DC | PRN
  Administered 2017-08-18: 14 mL/h via EPIDURAL
  Filled 2017-08-18: qty 100

## 2017-08-18 MED ORDER — OXYCODONE-ACETAMINOPHEN 5-325 MG PO TABS: 2.0000 | ORAL_TABLET | ORAL | Status: DC | PRN

## 2017-08-18 MED ORDER — ZOLPIDEM TARTRATE 5 MG PO TABS: 5.0000 mg | ORAL_TABLET | Freq: Every evening | ORAL | Status: DC | PRN

## 2017-08-18 MED ORDER — PHENYLEPHRINE 40 MCG/ML (10ML) SYRINGE FOR IV PUSH (FOR BLOOD PRESSURE SUPPORT)
80.0000 ug | PREFILLED_SYRINGE | INTRAVENOUS | Status: DC | PRN
  Filled 2017-08-18: qty 10

## 2017-08-18 MED ORDER — IBUPROFEN 600 MG PO TABS
600.0000 mg | ORAL_TABLET | Freq: Four times a day (QID) | ORAL | Status: DC
  Administered 2017-08-18 – 2017-08-20 (×6): 600 mg via ORAL
  Filled 2017-08-18 (×7): qty 1

## 2017-08-18 MED ORDER — FENTANYL CITRATE (PF) 100 MCG/2ML IJ SOLN
INTRAMUSCULAR | Status: AC
  Filled 2017-08-18: qty 2

## 2017-08-18 MED ORDER — SOD CITRATE-CITRIC ACID 500-334 MG/5ML PO SOLN: 30.0000 mL | ORAL | Status: DC | PRN

## 2017-08-18 MED ORDER — FLEET ENEMA 7-19 GM/118ML RE ENEM: 1.0000 | ENEMA | RECTAL | Status: DC | PRN

## 2017-08-18 MED ORDER — LIDOCAINE HCL (PF) 1 % IJ SOLN
INTRAMUSCULAR | Status: DC | PRN
  Administered 2017-08-18 (×2): 5 mL via EPIDURAL

## 2017-08-18 MED ORDER — LACTATED RINGERS IV SOLN: 500.0000 mL | INTRAVENOUS | Status: DC | PRN

## 2017-08-18 MED ORDER — OXYTOCIN 40 UNITS IN LACTATED RINGERS INFUSION - SIMPLE MED
2.5000 [IU]/h | INTRAVENOUS | Status: DC
Start: 2017-08-18 — End: 2017-08-18
  Filled 2017-08-18: qty 1000

## 2017-08-18 MED ORDER — OXYCODONE HCL 5 MG PO TABS
10.0000 mg | ORAL_TABLET | ORAL | Status: DC | PRN
Start: 2017-08-18 — End: 2017-08-20

## 2017-08-18 MED ORDER — ONDANSETRON HCL 4 MG/2ML IJ SOLN: 4.0000 mg | Freq: Four times a day (QID) | INTRAMUSCULAR | Status: DC | PRN

## 2017-08-18 NOTE — H&P (Signed)
Andrea AlarMeredith Torres is a 26 y.o. female presenting for labor. OB History    Gravida Para Term Preterm AB Living   2       1     SAB TAB Ectopic Multiple Live Births       1         History reviewed. No pertinent past medical history. History reviewed. No pertinent surgical history. Family History: family history includes Cancer in her maternal grandmother; Depression in her other; Hypertension in her father and other; Stroke in her other. Social History:  reports that she has never smoked. She has never used smokeless tobacco. She reports that she does not drink alcohol or use drugs.     Maternal Diabetes: No Genetic Screening: Normal Maternal Ultrasounds/Referrals: Normal Fetal Ultrasounds or other Referrals:  None Maternal Substance Abuse:  No Significant Maternal Medications:  None Significant Maternal Lab Results:  None Other Comments:  None  ROS History Dilation: 5 Effacement (%): 80 Station: -2 Exam by:: Ginnie Smartachel Schmidt RN Blood pressure 124/71, pulse 75, temperature 97.6 F (36.4 C), temperature source Oral, resp. rate 18, height 5\' 4"  (1.626 m), weight 59 kg (130 lb), SpO2 100 %, unknown if currently breastfeeding. Exam Physical Exam  Prenatal labs: ABO, Rh: O/Positive/-- (03/29 0000) Antibody: Negative (03/29 0000) Rubella: Immune (03/29 0000) RPR: Nonreactive (03/29 0000)  HBsAg: Negative (03/29 0000)  HIV: Non-reactive (03/29 0000)  GBS: Negative (09/20 0000)   Assessment/Plan: 26yo G2P0010 @ 39.3wga presenting in labor. 5cm on arrival. Expectant management. Plan AROM and pitocin prn. GBS neg.    Andrea Torres 08/18/2017, 8:40 AM

## 2017-08-18 NOTE — Anesthesia Preprocedure Evaluation (Signed)
Anesthesia Evaluation  Patient identified by MRN, date of birth, ID band Patient awake    Reviewed: Allergy & Precautions, H&P , NPO status , Patient's Chart, lab work & pertinent test results  Airway Mallampati: II   Neck ROM: full    Dental   Pulmonary neg pulmonary ROS,    breath sounds clear to auscultation       Cardiovascular negative cardio ROS   Rhythm:regular Rate:Normal     Neuro/Psych    GI/Hepatic   Endo/Other    Renal/GU      Musculoskeletal   Abdominal   Peds  Hematology   Anesthesia Other Findings   Reproductive/Obstetrics (+) Pregnancy                             Anesthesia Physical Anesthesia Plan  ASA: II  Anesthesia Plan: Epidural   Post-op Pain Management:    Induction: Intravenous  PONV Risk Score and Plan: 2 and Treatment may vary due to age or medical condition  Airway Management Planned: Natural Airway  Additional Equipment:   Intra-op Plan:   Post-operative Plan:   Informed Consent: I have reviewed the patients History and Physical, chart, labs and discussed the procedure including the risks, benefits and alternatives for the proposed anesthesia with the patient or authorized representative who has indicated his/her understanding and acceptance.       Plan Discussed with: CRNA, Anesthesiologist and Surgeon  Anesthesia Plan Comments:         Anesthesia Quick Evaluation  

## 2017-08-18 NOTE — Progress Notes (Signed)
Called lab to verify lab order being processed.  Sanita informed me that the blood collected had clotted.  A lab tech will come to draw again now.

## 2017-08-18 NOTE — Anesthesia Procedure Notes (Signed)
Epidural Patient location during procedure: OB Start time: 08/18/2017 9:55 AM End time: 08/18/2017 10:05 AM  Staffing Anesthesiologist: Chaney MallingHODIERNE, Monterio Bob Performed: anesthesiologist   Preanesthetic Checklist Completed: patient identified, site marked, pre-op evaluation, timeout performed, IV checked, risks and benefits discussed and monitors and equipment checked  Epidural Patient position: sitting Prep: DuraPrep Patient monitoring: heart rate, cardiac monitor, continuous pulse ox and blood pressure Approach: midline Location: L2-L3 Injection technique: LOR saline  Needle:  Needle type: Tuohy  Needle gauge: 17 G Needle length: 9 cm Needle insertion depth: 5 cm Catheter type: closed end flexible Catheter size: 19 Gauge Catheter at skin depth: 11 cm Test dose: negative and Other  Assessment Events: blood not aspirated, injection not painful, no injection resistance and negative IV test  Additional Notes Informed consent obtained prior to proceeding including risk of failure, 1% risk of PDPH, risk of minor discomfort and bruising.  Discussed rare but serious complications including epidural abscess, permanent nerve injury, epidural hematoma.  Discussed alternatives to epidural analgesia and patient desires to proceed.  Timeout performed pre-procedure verifying patient name, procedure, and platelet count.  Patient tolerated procedure well. Reason for block:procedure for pain

## 2017-08-18 NOTE — MAU Note (Signed)
Pt has had contractions since 12 am . About 6 min apart  . Had yesterday and she was 1.5cm. Denies and vag bleeding or leaking. Good fetal movement reported.

## 2017-08-18 NOTE — Progress Notes (Signed)
S/p AROM for clear fluid @ ~1030. Now c/c+2, start to push.   Rosie FateE Akshita Italiano MD

## 2017-08-18 NOTE — Anesthesia Pain Management Evaluation Note (Signed)
  CRNA Pain Management Visit Note  Patient: Andrea AlarMeredith Dziedzic, 26 y.o., female  "Hello I am a member of the anesthesia team at [facility]. We have an anesthesia team available at all times to provide care throughout the hospital, including epidural management and anesthesia for C-section. I don't know your plan for the delivery whether it a natural birth, water birth, IV sedation, nitrous supplementation, doula or epidural, but we want to meet your pain goals."   1.Was your pain managed to your expectations on prior hospitalizations?   Yes   2.What is your expectation for pain management during this hospitalization?     Epidural  3.How can we help you reach that goal? epidural  Record the patient's initial score and the patie  Pain: 9/10  Pain Goal: 1/10 [facility] wants you to be able to say your pain was always managed very well.  Salome ArntSterling, Bethanie Bloxom Marie 08/18/2017

## 2017-08-19 LAB — CBC
HEMATOCRIT: 28.1 % — AB (ref 36.0–46.0)
Hemoglobin: 9 g/dL — ABNORMAL LOW (ref 12.0–15.0)
MCH: 27 pg (ref 26.0–34.0)
MCHC: 32 g/dL (ref 30.0–36.0)
MCV: 84.4 fL (ref 78.0–100.0)
Platelets: 239 10*3/uL (ref 150–400)
RBC: 3.33 MIL/uL — AB (ref 3.87–5.11)
RDW: 13.9 % (ref 11.5–15.5)
WBC: 17.9 10*3/uL — AB (ref 4.0–10.5)

## 2017-08-19 NOTE — Lactation Note (Signed)
This note was copied from a baby's chart. Lactation Consultation Note New mom BF baby when LC entered rm. Mom BF in football position. Baby needed some re-adjustment in body alignment. Mom had baby in outfit, hat waddled in blanket. Encouraged to BF STS,  Mom has round breast w/everted nipples. Rt. Breast a cup size smaller than Lt. Could hear clicking. Adjusted chin and flange, all though flange look. Adjusted latch several times, noted improvement when sand whiching breast to firm breast. Rt. Breast small, feel full, Lt, breast softer than Rt. Small areolas. Hand expression taught and demonstrated w/dot of colostrum. Gave mom hand pump for stimulation. Shells give to assist in everting nipples more and decrease edema.  Educated newborn behavior, feeding habits, STS, I&O, cluster feeding, supply and demand. Mom encouraged to feed baby 8-12 times/24 hours and with feeding cues.  Encouraged mom to call for assistance in latching.  [facility] brochure given w/resources, support groups and LC services.   Patient Name: Andrea Torres             . Today's Date: 08/19/2017 Reason for consult: Initial assessment   Maternal Data Has patient been taught Hand Expression?: Yes Does the patient have breastfeeding experience prior to this delivery?: No  Feeding Feeding Type: Breast Fed Length of feed: 20 min  LATCH Score Latch: Repeated attempts needed to sustain latch, nipple held in mouth throughout feeding, stimulation needed to elicit sucking reflex.  Audible Swallowing: A few with stimulation  Type of Nipple: Everted at rest and after stimulation  Comfort (Breast/Nipple): Filling, red/small blisters or bruises, mild/mod discomfort  Hold (Positioning): Assistance needed to correctly position infant at breast and maintain latch.  LATCH Score: 6  Interventions Interventions: Breast feeding basics reviewed;Breast compression;Comfort gels;Assisted with latch;Adjust position;Hand  pump;Skin to skin;Support pillows;Breast massage;Position options;Hand express;Shells  Lactation Tools Discussed/Used Tools: Shells;Pump;Comfort gels Shell Type: Inverted Breast pump type: Manual Pump Review: Setup, frequency, and cleaning;Milk Storage Initiated by:: Peri JeffersonL. Shiloh Swopes RN IBCLC Date initiated:: 08/19/17   Consult Status Consult Status: Follow-up Date: 08/19/17 Follow-up type: In-patient    Charyl DancerCARVER, Solae Norling G 08/19/2017, 4:53 AM

## 2017-08-19 NOTE — Anesthesia Postprocedure Evaluation (Signed)
Anesthesia Post Note  Patient: Andrea Torres  Procedure(s) Performed: AN AD HOC LABOR EPIDURAL     Patient location during evaluation: Mother Baby Anesthesia Type: Epidural Level of consciousness: awake and alert and oriented Pain management: satisfactory to patient Vital Signs Assessment: post-procedure vital signs reviewed and stable Respiratory status: respiratory function stable Cardiovascular status: stable Postop Assessment: no headache, no backache, epidural receding, patient able to bend at knees, no signs of nausea or vomiting and adequate PO intake Anesthetic complications: no    Last Vitals:  Vitals:   08/18/17 1930 08/18/17 2330  BP: 137/74 124/77  Pulse: 96 92  Resp: 18 18  Temp: 36.8 C 37.3 C  SpO2: 99% 99%    Last Pain:  Vitals:   08/19/17 0629                                                                           08/19/17  0730  TempSrc:   PainSc: 3                                                                              2   Pain Goal: Patients Stated Pain Goal: 0 (08/18/17 0755)               Karleen DolphinFUSSELL,[place]

## 2017-08-19 NOTE — Progress Notes (Signed)
Patient stated no one told her about ordering food. I appologized no one told her and explained the process to her. Patient delivered yesterday at [id number].

## 2017-08-19 NOTE — Progress Notes (Signed)
Patient doing well.  BP 124/77 (BP Location: Right Arm)   Pulse 92   Temp 99.1 F (37.3 C) (Oral)   Resp 18   Ht 5\' 4"  (1.626 m)   Wt 59 kg (130 lb)   SpO2 99%   Breastfeeding? Unknown   BMI 22.31 kg/m  Results for orders placed or performed during the hospital encounter of 08/18/17 (from the past 24 hour(s))  Type and screen [facility]     Status: None   Collection Time: 08/18/17  8:49 AM  Result Value Ref Range   ABO/RH(D) O POS    Antibody Screen NEG    Sample Expiration 08/21/2017   RPR     Status: None   Collection Time: 08/18/17  8:49 AM  Result Value Ref Range   RPR Ser Ql Non Reactive Non Reactive  CBC     Status: Abnormal   Collection Time: 08/18/17  9:28 AM  Result Value Ref Range   WBC 14.2 (H) 4.0 - 10.5 K/uL   RBC 3.68 (L) 3.87 - 5.11 MIL/uL   Hemoglobin 10.2 (L) 12.0 - 15.0 g/dL   HCT 29.530.7 (L) 62.136.0 - 30.846.0 %   MCV 83.4 78.0 - 100.0 fL   MCH 27.7 26.0 - 34.0 pg   MCHC 33.2 30.0 - 36.0 g/dL   RDW 65.713.6 84.611.5 - 96.215.5 %   Platelets 260 150 - 400 K/uL  CBC     Status: Abnormal   Collection Time: 08/19/17  6:02 AM  Result Value Ref Range   WBC 17.9 (H) 4.0 - 10.5 K/uL   RBC 3.33 (L) 3.87 - 5.11 MIL/uL   Hemoglobin 9.0 (L) 12.0 - 15.0 g/dL   HCT 95.228.1 (L) 84.136.0 - 32.446.0 %   MCV 84.4 78.0 - 100.0 fL   MCH 27.0 26.0 - 34.0 pg   MCHC 32.0 30.0 - 36.0 g/dL   RDW 40.113.9 02.711.5 - 25.315.5 %   Platelets 239 150 - 400 K/uL   Uterus is non tender Lochia WNL PPD # 1  Doing well Routine care

## 2017-08-20 MED ORDER — IBUPROFEN 600 MG PO TABS: 600.0000 mg | ORAL_TABLET | Freq: Four times a day (QID) | ORAL | 0 refills | Status: DC

## 2017-08-20 NOTE — Discharge Summary (Signed)
Obstetric Discharge Summary Reason for Admission: onset of labor Prenatal Procedures: none Intrapartum Procedures: spontaneous vaginal delivery Postpartum Procedures: none Complications-Operative and Postpartum: 2nd degree perineal laceration Hemoglobin  Date Value Ref Range Status  08/19/2017 9.0 (L) 12.0 - 15.0 g/dL Final   HCT  Date Value Ref Range Status  08/19/2017 28.1 (L) 36.0 - 46.0 % Final    Physical Exam:  General: alert, cooperative and appears stated age 8Lochia: appropriate Uterine Fundus: firm Incision: healing well, no significant drainage, no dehiscence DVT Evaluation: No evidence of DVT seen on physical exam.  Discharge Diagnoses: Term Pregnancy-delivered  Discharge Information: Date: 08/20/2017 Activity: pelvic rest Diet: routine Medications: Ibuprofen Condition: improved Instructions: refer to practice specific booklet Discharge to: home   Newborn Data: Live born female  Birth Weight: 6 lb 10 oz (3005 g) APGAR: 9, 9  Newborn Delivery   Birth date/time:  08/18/2017 16:12:00 Delivery type:  Vaginal, Spontaneous Delivery      Home with mother.  Mikisha Roseland L 08/20/2017, 7:18 AM

## 2017-08-20 NOTE — Progress Notes (Signed)
Dr. Vincente PoliGrewal asked that I go into rm 121 because mom was sleeping with baby in bed.  Went to Rm 121 with Fayrene Fearingwayna Bernard, RN and mother and baby was [facility]ing [facility] and slouched down in fold of bed.  Infant was not visible.  Woke [facility] and explained risk for cosleeping and [facility].  Dr. Vincente PoliGrewal updated.

## 2017-08-20 NOTE — Progress Notes (Signed)
Nurse told me to not go inside the patient's room and get baby's weight at this time. They were both resting.

## 2017-08-20 NOTE — Lactation Note (Addendum)
This note was copied from a baby's chart. Lactation Consultation Note  Patient Name: Andrea Maryann AlarMeredith Flud IONGE'XToday's Date: 08/20/2017   P1, Baby 42 hours old.  Mother is supplementing with formula.  She has not breastfed since 0400. Baby's paternal grandmother in room and is encouraging formula although mother stated she really wants to breastfeed. Mother states that baby is not getting enough because she pumped with hand pump and only pumped a drop. Provided education to family how breastmilk comes to volume. Mother's nipples are sore.  Provided coconut oil, shells and comfort gels.   Worked on achieving depth with breastfeeding and hand positioning and flanging lips. Noted upper lip tightness and when not on deep, baby does some clicking. Encouraged mother to breastfeed before offering formula to help establish her milk supply. Mother has DEBP at home.  Suggest she post pump 4-5 times per day in addition to breastfeeding. Give baby back volume pump in addition to breastfeeding. Mother is currently supplementing with formula.  Reviewed volume guidelines. Mom encouraged to feed baby 8-12 times/24 hours and with feeding cues.  Suggest if mother stays, she should be set up with DEBP but at home mother does have quality DEBP.      Maternal Data    Feeding    LATCH Score                   Interventions    Lactation Tools Discussed/Used     Consult Status      [name], Andrea Torres 08/20/2017, 10:48 AM

## 2019-10-10 ENCOUNTER — Encounter (HOSPITAL_COMMUNITY): Payer: Self-pay

## 2019-10-10 ENCOUNTER — Inpatient Hospital Stay (HOSPITAL_COMMUNITY)
Admission: AD | Admit: 2019-10-10 | Discharge: 2019-10-10 | Disposition: A | Discharge status: Home / Self Care | Payer: Managed Care, Other (non HMO) | Attending: Obstetrics & Gynecology | Admitting: Obstetrics & Gynecology

## 2019-10-10 ENCOUNTER — Other Ambulatory Visit: Payer: Self-pay

## 2019-10-10 ENCOUNTER — Inpatient Hospital Stay (HOSPITAL_COMMUNITY): Payer: Managed Care, Other (non HMO)

## 2019-10-10 DIAGNOSIS — R1031 Right lower quadrant pain: Secondary | ICD-10-CM | POA: Diagnosis not present

## 2019-10-10 DIAGNOSIS — O26891 Other specified pregnancy related conditions, first trimester: Secondary | ICD-10-CM | POA: Diagnosis present

## 2019-10-10 DIAGNOSIS — O009 Unspecified ectopic pregnancy without intrauterine pregnancy: Secondary | ICD-10-CM | POA: Diagnosis not present

## 2019-10-10 DIAGNOSIS — O26899 Other specified pregnancy related conditions, unspecified trimester: Secondary | ICD-10-CM

## 2019-10-10 DIAGNOSIS — O00201 Right ovarian pregnancy without intrauterine pregnancy: Secondary | ICD-10-CM | POA: Diagnosis not present

## 2019-10-10 DIAGNOSIS — Z3A00 Weeks of gestation of pregnancy not specified: Secondary | ICD-10-CM | POA: Insufficient documentation

## 2019-10-10 DIAGNOSIS — Z6790 Unspecified blood type, Rh positive: Secondary | ICD-10-CM | POA: Diagnosis not present

## 2019-10-10 HISTORY — DX: Other specified health status: Z78.9

## 2019-10-10 LAB — CBC WITH DIFFERENTIAL/PLATELET
Abs Immature Granulocytes: 0.02 10*3/uL (ref 0.00–0.07)
Basophils Absolute: 0 10*3/uL (ref 0.0–0.1)
Basophils Relative: 0 %
Eosinophils Absolute: 0.1 10*3/uL (ref 0.0–0.5)
Eosinophils Relative: 0 %
HCT: 37.7 % (ref 36.0–46.0)
Hemoglobin: 12.4 g/dL (ref 12.0–15.0)
Immature Granulocytes: 0 %
Lymphocytes Relative: 24 %
Lymphs Abs: 2.8 10*3/uL (ref 0.7–4.0)
MCH: 29.2 pg (ref 26.0–34.0)
MCHC: 32.9 g/dL (ref 30.0–36.0)
MCV: 88.7 fL (ref 80.0–100.0)
Monocytes Absolute: 0.8 10*3/uL (ref 0.1–1.0)
Monocytes Relative: 7 %
Neutro Abs: 8.1 10*3/uL — ABNORMAL HIGH (ref 1.7–7.7)
Neutrophils Relative %: 69 %
Platelets: 322 10*3/uL (ref 150–400)
RBC: 4.25 MIL/uL (ref 3.87–5.11)
RDW: 12.5 % (ref 11.5–15.5)
WBC: 11.8 10*3/uL — ABNORMAL HIGH (ref 4.0–10.5)
nRBC: 0 % (ref 0.0–0.2)

## 2019-10-10 LAB — WET PREP, GENITAL
Clue Cells Wet Prep HPF POC: NONE SEEN
Sperm: NONE SEEN
Trich, Wet Prep: NONE SEEN
Yeast Wet Prep HPF POC: NONE SEEN

## 2019-10-10 LAB — COMPREHENSIVE METABOLIC PANEL
ALT: 16 U/L (ref 0–44)
AST: 20 U/L (ref 15–41)
Albumin: 4 g/dL (ref 3.5–5.0)
Alkaline Phosphatase: 61 U/L (ref 38–126)
Anion gap: 9 (ref 5–15)
BUN: 9 mg/dL (ref 6–20)
CO2: 22 mmol/L (ref 22–32)
Calcium: 9 mg/dL (ref 8.9–10.3)
Chloride: 106 mmol/L (ref 98–111)
Creatinine, Ser: 0.8 mg/dL (ref 0.44–1.00)
GFR calc Af Amer: >60 mL/min (ref >60)
GFR calc non Af Amer: >60 mL/min (ref >60)
Glucose, Bld: 104 mg/dL — ABNORMAL HIGH (ref 70–99)
Potassium: 3.5 mmol/L (ref 3.5–5.1)
Sodium: 137 mmol/L (ref 135–145)
Total Bilirubin: 0.7 mg/dL (ref 0.3–1.2)
Total Protein: 7.1 g/dL (ref 6.5–8.1)

## 2019-10-10 LAB — URINALYSIS, ROUTINE W REFLEX MICROSCOPIC
Bilirubin Urine: NEGATIVE
Glucose, UA: NEGATIVE mg/dL
Hgb urine dipstick: NEGATIVE
Ketones, ur: 5 mg/dL — AB
Leukocytes,Ua: NEGATIVE
Nitrite: NEGATIVE
Protein, ur: 30 mg/dL — AB
Specific Gravity, Urine: 1.032 — ABNORMAL HIGH (ref 1.005–1.030)
pH: 5 (ref 5.0–8.0)

## 2019-10-10 LAB — POCT PREGNANCY, URINE: Preg Test, Ur: POSITIVE — AB

## 2019-10-10 LAB — HCG, QUANTITATIVE, PREGNANCY: hCG, Beta Chain, Quant, S: 1262 m[IU]/mL — ABNORMAL HIGH (ref <5)

## 2019-10-10 MED ORDER — METHOTREXATE FOR ECTOPIC PREGNANCY
50.0000 mg/m2 | Freq: Once | INTRAMUSCULAR | Status: AC
  Administered 2019-10-10: 75 mg via INTRAMUSCULAR
  Filled 2019-10-10: qty 1

## 2019-10-10 NOTE — MAU Note (Signed)
Andrea Torres is a 29 y.o. here in MAU reporting: woke up this AM with some abdominal pain, around 12 the pain was getting worse. Took some Advil and felt better. Took a UPT today after taking the Advil. States this feels like her previous ectopic pregnancy. No vaginal bleeding or abnormal discharge.  LMP: unknown, pt reports irregular menstrual cycles  Onset of complaint: today  Pain score: 2/10  Vitals:   10/10/19 1848  BP: (!) 127/56  Pulse: 99  Resp: 16  Temp: 98.4 F (36.9 C)  SpO2: 100%      Lab orders placed from triage: UA, UPT

## 2019-10-10 NOTE — Progress Notes (Signed)
Written and verbal d/c instructions given and understanding voiced. Will return to MAU Sat afternoon for repeat labs or sooner for any concerns. Plans to have Day #7 labs drawn in office.

## 2019-10-10 NOTE — Discharge Instructions (Signed)
Methotrexate Treatment for an Ectopic Pregnancy, Care After This sheet gives you information about how to care for yourself after your procedure. Your health care provider may also give you more specific instructions. If you have problems or questions, contact your health care provider. What can I expect after the procedure? After the procedure, it is common to have:  Abdominal cramping.  Vaginal bleeding.  Fatigue.  Nausea.  Vomiting.  Diarrhea. Blood tests will be taken at timed intervals for several days or weeks to check your pregnancy hormone levels. The blood tests will be done until the pregnancy hormone can no longer be detected in the blood. Follow these instructions at home: Activity  Do not have sex until your health care provider approves.  Limit activities that take a lot of effort as told by your health care provider. Medicines  Take over the counter and prescription medicines only as told by your health care provider.  Do not take aspirin, ibuprofen, naproxen, or any other NSAIDs.  Do not take folic acid, prenatal vitamins, or other vitamins that contain folic acid. General instructions   Do not drink alcohol.  Follow instructions from your health care provider on how and when to report any symptoms that may indicate a ruptured ectopic pregnancy.  Keep all follow-up visits as told by your health care provider. This is important. Contact a health care provider if:  You have persistent nausea and vomiting.  You have persistent diarrhea.  You are having a reaction to the medicine, such as: ? Tiredness. ? Skin rash. ? Hair loss. Get help right away if:  Your abdominal or pelvic pain gets worse.  You have more vaginal bleeding.  You feel light-headed or you faint.  You have shortness of breath.  Your heart rate increases.  You develop a cough.  You have chills.  You have a fever. Summary  After the procedure, it is common to have symptoms  of abdominal cramping, vaginal bleeding and fatigue. You may also experience other symptoms.  Blood tests will be taken at timed intervals for several days or weeks to check your pregnancy hormone levels. The blood tests will be done until the pregnancy hormone can no longer be detected in the blood.  Limit strenuous activity as told by your health care provider.  Follow instructions from your health care provider on how and when to report any symptoms that may indicate a ruptured ectopic pregnancy. This information is not intended to replace advice given to you by your health care provider. Make sure you discuss any questions you have with your health care provider. Document Released: 10/07/2011 Document Revised: 09/30/2017 Document Reviewed: 12/07/2016 Elsevier Patient Education  [place].  Methotrexate Treatment for an Ectopic Pregnancy  Methotrexate is a medicine that treats an ectopic pregnancy. An ectopic pregnancy is a pregnancy in which the fetus develops outside the uterus. This kind of pregnancy can be dangerous. Methotrexate works by stopping the growth of the fertilized egg. It also helps your body absorb tissue from the egg. This takes between 2-6 weeks. Most ectopic pregnancies can be successfully treated with methotrexate if they are diagnosed early. Tell a health care provider about:  Any allergies you have.  All medicines you are taking, including vitamins, herbs, eye drops, creams, and over-the-counter medicines.  Any medical conditions you have. What are the risks? Generally, this is a safe treatment. However, problems may occur, including:  Nausea or vomiting or both.  Vaginal bleeding or spotting.  Diarrhea.  Abdominal cramping.  Dizziness or feeling lightheaded.  Mouth sores.  Swelling or irritation of the lining of your lungs (pneumonitis).  Liver damage.  Hair loss. There is a risk that methotrexate treatment will fail and your pregnancy will  continue. There is also a risk that the ectopic pregnancy might rupture while you are using this medicine. What happens before the procedure?  Liver tests, kidney tests, and a complete blood test will be done.  Blood tests will be done to measure the pregnancy hormone levels and to determine your blood type.  If you are Rh-negative and the father is Rh-positive or his Rh type is not known, you will be given a Rho (D) immune globulin shot. What happens during the procedure? Your health care provider may give you methotrexate by injection or in the form of a pill. Methotrexate may be given as a single dose of medicine or a series of doses, depending on your response to the treatment.  Methotrexate injections will be given by your health care provider. This is the most common way that methotrexate is used to treat an ectopic pregnancy.  If you are prescribed oral methotrexate, it is very important that you follow your health care provider's instructions on how to take oral methotrexate. Additional medicines may be needed to manage an ectopic pregnancy. The procedure may vary among health care providers and hospitals. What happens after the procedure?  You may have abdominal cramping, vaginal bleeding, and fatigue.  Blood tests will be taken at timed intervals for several days or weeks to check your pregnancy hormone levels. The blood tests will be done until the pregnancy hormone can no longer be detected in the blood.  You may need to have a surgical procedure to remove the ectopic pregnancy if methotrexate treatment fails.  Follow instructions from your health care provider on how and when to report any symptoms that may indicate a ruptured ectopic pregnancy. Summary  Methotrexate is a medicine that treats an ectopic pregnancy.  Methotrexate may be given in a single dose or a series of doses over time.  Blood tests will be taken at timed intervals for several days or weeks to check your  pregnancy hormone levels. The blood tests will be done until no more pregnancy hormone is detected in the blood.  There is a risk that methotrexate treatment will fail and your pregnancy will continue. There is also a risk that the ectopic pregnancy might rupture while you are using this medicine. This information is not intended to replace advice given to you by your health care provider. Make sure you discuss any questions you have with your health care provider. Document Released: 10/12/2001 Document Revised: 09/30/2017 Document Reviewed: 12/07/2016 Elsevier Patient Education  2020 [employer] Ectopic Pregnancy  An ectopic pregnancy is when the fertilized egg attaches (implants) outside the uterus. Most ectopic pregnancies occur in one of the tubes where eggs travel from the ovary to the uterus (fallopian tubes), but the implanting can occur in other locations. In rare cases, ectopic pregnancies occur on the ovary, intestine, pelvis, abdomen, or cervix. In an ectopic pregnancy, the fertilized egg does not have the ability to develop into a normal, healthy baby. A ruptured ectopic pregnancy is one in which tearing or bursting of a fallopian tube causes internal bleeding. Often, there is intense lower abdominal pain, and vaginal bleeding sometimes occurs. Having an ectopic pregnancy can be life-threatening. If this dangerous condition is not treated, it can lead to blood loss, shock,  or even death. What are the causes? The most common cause of this condition is damage to one of the fallopian tubes. A fallopian tube may be narrowed or blocked, and that keeps the fertilized egg from reaching the uterus. What increases the risk? This condition is more likely to develop in women of childbearing age who have different levels of risk. The levels of risk can be divided into three categories. High risk  You have gone through infertility treatment.  You have had an ectopic pregnancy before.  You have  had surgery on the fallopian tubes, or another surgical procedure, such as an abortion.  You have had surgery to have the fallopian tubes tied (tubal ligation).  You have problems or diseases of the fallopian tubes.  You have been exposed to diethylstilbestrol (DES). This medicine was used until 1971, and it had effects on babies whose mothers took the medicine.  You become pregnant while using an IUD (intrauterine device) for birth control. Moderate risk  You have a history of infertility.  You have had an STI (sexually transmitted infection).  You have a history of pelvic inflammatory disease (PID).  You have scarring from endometriosis.  You have multiple sexual partners.  You smoke. Low risk  You have had pelvic surgery.  You use vaginal douches.  You became sexually active before age 41. What are the signs or symptoms? Common symptoms of this condition include normal pregnancy symptoms, such as missing a period, nausea, tiredness, abdominal pain, breast tenderness, and bleeding. However, ectopic pregnancy will have additional symptoms, such as:  Pain with intercourse.  Irregular vaginal bleeding or spotting.  Cramping or pain on one side or in the lower abdomen.  Fast heartbeat, low blood pressure, and sweating.  Passing out while having a bowel movement. Symptoms of a ruptured ectopic pregnancy and internal bleeding may include:  Sudden, severe pain in the abdomen and pelvis.  Dizziness, weakness, light-headedness, or fainting.  Pain in the shoulder or neck area. How is this diagnosed? This condition is diagnosed by:  A pelvic exam to locate pain or a mass in the abdomen.  A pregnancy test. This blood test checks for the presence as well as the specific level of pregnancy hormone in the bloodstream.  Ultrasound. This is performed if a pregnancy test is positive. In this test, a probe is inserted into the vagina. The probe will detect a fetus, possibly in a  location other than the uterus.  Taking a sample of uterus tissue (dilation and curettage, or D&C).  Surgery to perform a visual exam of the inside of the abdomen using a thin, lighted tube that has a tiny camera on the end (laparoscope).  Culdocentesis. This procedure involves inserting a needle at the top of the vagina, behind the uterus. If blood is present in this area, it may indicate that a fallopian tube is torn. How is this treated? This condition is treated with medicine or surgery. Medicine  An injection of a medicine (methotrexate) may be given to cause the pregnancy tissue to be absorbed. This medicine may save your fallopian tube. It may be given if: ? The diagnosis is made early, with no signs of active bleeding. ? The fallopian tube has not ruptured. ? You are considered to be a good candidate for the medicine. Usually, pregnancy hormone blood levels are checked after methotrexate treatment. This is to be sure that the medicine is effective. It may take 4-6 weeks for the pregnancy to be absorbed. Most  pregnancies will be absorbed by 3 weeks. Surgery  A laparoscope may be used to remove the pregnancy tissue.  If severe internal bleeding occurs, a larger cut (incision) may be made in the lower abdomen (laparotomy) to remove the fetus and placenta. This is done to stop the bleeding.  Part or all of the fallopian tube may be removed (salpingectomy) along with the fetus and placenta. The fallopian tube may also be repaired during the surgery.  In very rare circumstances, removal of the uterus (hysterectomy) may be required.  After surgery, pregnancy hormone testing may be done to be sure that there is no pregnancy tissue left. Whether your treatment is medicine or surgery, you may receive a Rho (D) immune globulin shot to prevent problems with any future pregnancy. This shot may be given if:  You are Rh-negative and the baby's father is Rh-positive.  You are Rh-negative and  you do not know the Rh type of the baby's father. Follow these instructions at home:  Rest and limit your activity after the procedure for as long as told by your health care provider.  Until your health care provider says that it is safe: ? Do not lift anything that is heavier than 10 lb (4.5 kg), or the limit that your health care provider tells you. ? Avoid physical exercise and any movement that requires effort (is strenuous).  To help prevent constipation: ? Eat a healthy diet that includes fruits, vegetables, and whole grains. ? Drink 6-8 glasses of water per day. Get help right away if:  You develop worsening pain that is not relieved by medicine.  You have: ? A fever or chills. ? Vaginal bleeding. ? Redness and swelling at the incision site. ? Nausea and vomiting.  You feel dizzy or weak.  You feel light-headed or you faint. This information is not intended to replace advice given to you by your health care provider. Make sure you discuss any questions you have with your health care provider. Document Released: 11/25/2004 Document Revised: 09/30/2017 Document Reviewed: 05/19/2016 Elsevier Patient Education  2020 [employer].

## 2019-10-10 NOTE — MAU Provider Note (Signed)
History     CSN: [id number]  Arrival date and time: 10/10/19 3295   First Provider Initiated Contact with Patient 10/10/19 1919      Chief Complaint  Patient presents with  . Abdominal Pain   Ms. TANAY MASSIAH is a 28 y.o. G3P1011 at Unknown who presents to MAU for RLQ pain. Pt reports a history of ectopic pregnancy, year unknown, that was treated with MTX and reports this pain feels the same as when she had her previous ectopic.  Onset: this morning, worsening around noon Location: RLQ Duration: <24hrs Character: sharp, stabbing Aggravating/Associated: none/none Relieving: Advil Treatment: Advil around noon today Severity: 2/10  Pt denies VB, vaginal discharge/odor/itching. Pt denies N/V, constipation, diarrhea, or urinary problems. Pt denies fever, chills, fatigue, sweating or changes in appetite. Pt denies SOB or chest pain. Pt denies dizziness, HA, light-headedness, weakness.  Problems this pregnancy include: pt just found out today she was pregnant. Allergies? NKDA Current medications/supplements? none Prenatal care provider? plans to see Dr. Corinna Capra, if viable pregnancy   OB History    Gravida  3   Para  1   Term  1   Preterm  0   AB  1   Living  1     SAB  0   TAB  0   Ectopic  1   Multiple  0   Live Births  1           Past Medical History:  Diagnosis Date  . Medical history non-contributory     Past Surgical History:  Procedure Laterality Date  . NO PAST SURGERIES      Family History  Problem Relation Age of Onset  . Hypertension Father   . Hypertension Other   . Stroke Other   . Depression Other   . Cancer Maternal Grandmother     Social History   Tobacco Use  . Smoking status: Never Smoker  . Smokeless tobacco: Never Used  Substance Use Topics  . Alcohol use: No  . Drug use: No    Allergies: No Known Allergies  Medications Prior to Admission  Medication Sig Dispense Refill Last Dose  . ibuprofen  (ADVIL,MOTRIN) 600 MG tablet Take 1 tablet (600 mg total) by mouth every 6 (six) hours. 30 tablet 0   . Prenatal Vit-Fe Fumarate-FA (PRENATAL MULTIVITAMIN) TABS tablet Take 1 tablet by mouth daily at 12 noon.       Review of Systems  Constitutional: Negative for chills, diaphoresis, fatigue and fever.  Eyes: Negative for visual disturbance.  Respiratory: Negative for shortness of breath.   Cardiovascular: Negative for chest pain.  Gastrointestinal: Positive for abdominal pain. Negative for constipation, diarrhea, nausea and vomiting.  Genitourinary: Negative for dysuria, flank pain, frequency, pelvic pain, urgency, vaginal bleeding and vaginal discharge.  Neurological: Negative for dizziness, weakness, light-headedness and headaches.   Physical Exam   Blood pressure (!) 127/56, pulse 99, temperature 98.4 F (36.9 C), temperature source Oral, resp. rate 16, height _0  (1.676 m), weight 51.2 kg, SpO2 100 %, unknown if currently breastfeeding.  Patient Vitals for the past 24 hrs:  BP Temp Temp src Pulse Resp SpO2 Height Weight  10/10/19 1848 (!) 127/56 98.4 F (36.9 C) Oral 99 16 100 % - -  10/10/19 1844 - - - - - - _1  (1.676 m) 51.2 kg   Physical Exam  Constitutional: She is oriented to person, place, and time. She appears well-developed and well-nourished. No distress.  HENT:  Head:  Normocephalic and atraumatic.  Respiratory: Effort normal.  GI: Soft. She exhibits no distension and no mass. There is no abdominal tenderness. There is no rebound and no guarding.  Neurological: She is alert and oriented to person, place, and time.  Skin: Skin is warm and dry. She is not diaphoretic.  Psychiatric: She has a normal mood and affect. Her behavior is normal. Judgment and thought content normal.   Results for orders placed or performed during the hospital encounter of 10/10/19 (from the past 24 hour(s))  Pregnancy, urine POC     Status: Abnormal   Collection Time: 10/10/19  6:38 PM   Result Value Ref Range   Preg Test, Ur POSITIVE (A) NEGATIVE  Wet prep, genital     Status: Abnormal   Collection Time: 10/10/19  6:55 PM   Specimen: PATH Cytology Cervicovaginal Ancillary Only  Result Value Ref Range   Yeast Wet Prep HPF POC NONE SEEN NONE SEEN   Trich, Wet Prep NONE SEEN NONE SEEN   Clue Cells Wet Prep HPF POC NONE SEEN NONE SEEN   WBC, Wet Prep HPF POC MODERATE (A) NONE SEEN   Sperm NONE SEEN   CBC with Differential/Platelet     Status: Abnormal   Collection Time: 10/10/19  7:04 PM  Result Value Ref Range   WBC 11.8 (H) 4.0 - 10.5 K/uL   RBC 4.25 3.87 - 5.11 MIL/uL   Hemoglobin 12.4 12.0 - 15.0 g/dL   HCT 37.7 36.0 - 46.0 %   MCV 88.7 80.0 - 100.0 fL   MCH 29.2 26.0 - 34.0 pg   MCHC 32.9 30.0 - 36.0 g/dL   RDW 12.5 11.5 - 15.5 %   Platelets 322 150 - 400 K/uL   nRBC 0.0 0.0 - 0.2 %   Neutrophils Relative % 69 %   Neutro Abs 8.1 (H) 1.7 - 7.7 K/uL   Lymphocytes Relative 24 %   Lymphs Abs 2.8 0.7 - 4.0 K/uL   Monocytes Relative 7 %   Monocytes Absolute 0.8 0.1 - 1.0 K/uL   Eosinophils Relative 0 %   Eosinophils Absolute 0.1 0.0 - 0.5 K/uL   Basophils Relative 0 %   Basophils Absolute 0.0 0.0 - 0.1 K/uL   Immature Granulocytes 0 %   Abs Immature Granulocytes 0.02 0.00 - 0.07 K/uL  Comprehensive metabolic panel     Status: Abnormal   Collection Time: 10/10/19  7:04 PM  Result Value Ref Range   Sodium 137 135 - 145 mmol/L   Potassium 3.5 3.5 - 5.1 mmol/L   Chloride 106 98 - 111 mmol/L   CO2 22 22 - 32 mmol/L   Glucose, Bld 104 (H) 70 - 99 mg/dL   BUN 9 6 - 20 mg/dL   Creatinine, Ser 0.80 0.44 - 1.00 mg/dL   Calcium 9.0 8.9 - 10.3 mg/dL   Total Protein 7.1 6.5 - 8.1 g/dL   Albumin 4.0 3.5 - 5.0 g/dL   AST 20 15 - 41 U/L   ALT 16 0 - 44 U/L   Alkaline Phosphatase 61 38 - 126 U/L   Total Bilirubin 0.7 0.3 - 1.2 mg/dL   GFR calc non Af Amer >60 >60 mL/min   GFR calc Af Amer >60 >60 mL/min   Anion gap 9 5 - 15  hCG, quantitative, pregnancy      Status: Abnormal   Collection Time: 10/10/19  7:04 PM  Result Value Ref Range   hCG, Beta Chain, Quant, S 1,262 (H) <5  mIU/mL  Urinalysis, Routine w reflex microscopic     Status: Abnormal   Collection Time: 10/10/19  8:10 PM  Result Value Ref Range   Color, Urine YELLOW YELLOW   APPearance CLEAR CLEAR   Specific Gravity, Urine 1.032 (H) 1.005 - 1.030   pH 5.0 5.0 - 8.0   Glucose, UA NEGATIVE NEGATIVE mg/dL   Hgb urine dipstick NEGATIVE NEGATIVE   Bilirubin Urine NEGATIVE NEGATIVE   Ketones, ur 5 (A) NEGATIVE mg/dL   Protein, ur 30 (A) NEGATIVE mg/dL   Nitrite NEGATIVE NEGATIVE   Leukocytes,Ua NEGATIVE NEGATIVE   RBC / HPF 0-5 0 - 5 RBC/hpf   WBC, UA 0-5 0 - 5 WBC/hpf   Bacteria, UA RARE (A) NONE SEEN   Squamous Epithelial / LPF 0-5 0 - 5   Mucus PRESENT    US Ob Less Than 14 Weeks With Ob Transvaginal  Result Date: 10/10/2019 CLINICAL DATA:  Positive pregnancy test, history of ectopic pregnancy with pelvic pain, initial encounter EXAM: OBSTETRIC <14 WK [place] AND TRANSVAGINAL OB US TECHNIQUE: Both transabdominal and transvaginal ultrasound examinations were performed for complete evaluation of the gestation as well as the maternal uterus, adnexal regions, and pelvic cul-de-sac. Transvaginal technique was performed to assess early pregnancy. COMPARISON:  None. FINDINGS: Intrauterine gestational sac: Absent Subchorionic hemorrhage:  None visualized. Maternal uterus/adnexae: The uterus appears within normal limits with normal endometrial stripe. The left ovary is unremarkable. Adjacent to the right ovary there is a complex area identified measuring 1.9 cm in greatest dimension. Increased vascularity is noted in this region. Moderate free fluid is noted within the pelvis. IMPRESSION: Complex irregular area with increased blood flow adjacent to the right ovary as described. Moderate free fluid is noted within the pelvis. These findings in combination with positive pregnancy test RI the suggestive  of ectopic pregnancy on the right. No intrauterine gestation is noted. Critical Value/emergent results were called by telephone at the time of interpretation on 10/10/2019 at 8:44 pm to Rhyen Mazariego, NP , who verbally acknowledged these results. Electronically Signed   By: Inez Catalina M.D.   On: 10/10/2019 20:44    MAU Course  Procedures  MDM -r/o ectopic -UA: SG 1.032/5ketones/30PRO/rare bacteria, sending urine for culture -CBC: WNL for pregnancy, WBCs 11.8 -CMP: WNL -[place]: Complex irregular area (1.9cm) with increased blood flow adjacent to the right ovary as described. Moderate free fluid is noted within the pelvis. These findings in combination with positive pregnancy test RI the suggestive of ectopic pregnancy on the right. -hCG: 1,262 -ABO: O Positive -WetPrep: WNL -GC/CT collected -consulted with Dr. Rip Harbour re: MTX administration, per Dr. Rip Harbour, [place] to given MTX with moderate, free pelvic fluid. The risks of methotrexate were reviewed including failure requiring repeat dosing or eventual surgery. She understands that methotrexate involves frequent return visits to monitor lab values and that she remains at risk of ectopic rupture until her beta is less than assay. ?The patient opts to proceed with methotrexate.  She has no history of hepatic or renal dysfunction, has normal BUN/Cr/LFT's/platelets. She is felt to be reliable for follow-up. Side effects of photosensitivity & GI upset were discussed. She knows to avoid direct sunlight and abstain from alcohol, NSAIDs and sexual intercourse for two weeks. She was counseled to discontinue any MVI with folic acid. ?She understands to follow up on D4 (Saturday 10/13/2019) and D7 (Tuesday 10/16/2019) for repeat BHCG and was given the instruction sheet. ?Strict ectopic precautions were reviewed, the patient knows to call with any  abdominal pain, vomiting, fainting, or any concerns with her health.  Day 0/1 Day 4 Day 7  Sunday Wednesday Saturday   Monday Thursday Sunday  Tuesday Friday Monday  Wednesday Saturday Tuesday  Thursday Sunday Wednesday  Friday Monday Thursday  Saturday Tuesday Friday    Methotrexate Treatment Protocol for Ectopic Pregnancy  xPretreatment testing and instructions  xhCG concentration (1,262) xTransvaginal ultrasound  xBlood group and Rh(D) typing (O Positive) xComplete blood count (WNL, WBCs 11.8) xLiver and renal function tests (WNL) xDiscontinue folic acid supplements  xCounsel patient to avoid NSAIDs, recommend acetaminophen if an analgesic is needed  xAdvise patient to refrain from sexual intercourse and strenuous exercise  Treatment day  Single dose protocol   1  hCG.  Administer Methotrexate 50 mg/m2 body surface area IM  4  hCG  7  hCG  If <15 percent hCG decline from day 4 to 7, give additional dose of methotrexate 50 mg/m2 IM  If ?15 percent hCG decline from day 4 to 7, draw hCG weekly until undetectable  14  hCG  If <15 percent hCG decline from day 7 to 14, give additional dose of methotrexate 50 mg/m2 IM  If ?15 percent hCG decline from day 7 to 14, check hCG weekly until undetectable  21 and 28  If 3 doses have been given and there is a <15 percent hCG decline from day 21 to 28, proceed with laparoscopic surgery  Laparoscopy  If severe abdominal pain or an acute abdomen suggestive of tubal rupture occurs If ultrasonography reveals greater than 300 mL pelvic or other intraperitoneal fluid  The hCG concentration usually declines to less than 15 mIU/mL by 35 days postinjection but may take as long as 109 days. If the hCG does not decline to zero, a new pregnancy should be excluded; if the hCG is rising, a transvaginal ultrasound should be performed. Alternatively, some patients have a slow clearance of serum hCG. If three weekly values are similar, consider an additional dose of MTX (50 mg/m2) not to exceed the recommended maximum of three total doses. This typically accelerates the decline  of serum hCG. The risk of gestational trophoblastic disease is low. Folinic acid rescue is not required for women treated with the single-dose protocol, even if multiple doses are ultimately given.   Prepared with data from:   [facility]. Clinical practice. Ectopic pregnancy. [place] Med 2009; 361:379  American College of Obstetricians and Gynecologists. ACOG Practice Bulletin No. 94: Medical management of ectopic pregnancy. Obstet Gynecol 2008; 147:0929.  -pt discharged to home in stable condition  Orders Placed This Encounter  Procedures  . Wet prep, genital    Standing Status:   Standing    Number of Occurrences:   1  . Culture, OB Urine    Standing Status:   Standing    Number of Occurrences:   1  . US OB LESS THAN 14 WEEKS WITH OB TRANSVAGINAL    Standing Status:   Standing    Number of Occurrences:   1    Order Specific Question:   Symptom/Reason for Exam    Answer:   Abdominal pain in pregnancy [[id number]]  . Urinalysis, Routine w reflex microscopic    Standing Status:   Standing    Number of Occurrences:   1  . CBC with Differential/Platelet    Standing Status:   Standing    Number of Occurrences:   1  . Comprehensive metabolic panel    Standing Status:  Standing    Number of Occurrences:   1  . hCG, quantitative, pregnancy    Standing Status:   Standing    Number of Occurrences:   1  . Pregnancy, urine POC    Standing Status:   Standing    Number of Occurrences:   1  . Discharge patient    Order Specific Question:   Discharge disposition    Answer:   01-Home or Self Care [1]    Order Specific Question:   Discharge patient date    Answer:   10/10/2019   Meds ordered this encounter  Medications  . methotrexate [facility]) chemo injection kit 75 mg   Assessment and Plan   1. Ectopic pregnancy of right ovary   2. Abdominal pain in pregnancy   3. Blood type, Rh positive    Allergies as of 10/10/2019   No Known Allergies     Medication List    STOP taking these  medications   ibuprofen 600 MG tablet Commonly known as: ADVIL   prenatal multivitamin Tabs tablet      -pt to return to MAU on Saturday for Day 4 hCG, signed and held orders entered -pt prefers f/u at [facility], called after hours line and left message to call patient for Day 7 hCG on Tuesday 10/16/2019, let them know that patient prefers Dr. Corinna Capra -strict ectopic/pain/bleeding/return MAU precautions given -pt discharged to home in stable condition  Gerrie Nordmann Alen Matheson 10/10/2019, 9:39 PM

## 2019-10-11 LAB — CULTURE, OB URINE: Culture: NO GROWTH

## 2019-10-12 LAB — GC/CHLAMYDIA PROBE AMP (~~LOC~~) NOT AT ARMC
Chlamydia: NEGATIVE
Comment: NEGATIVE
Comment: NORMAL
Neisseria Gonorrhea: NEGATIVE

## 2019-10-13 ENCOUNTER — Other Ambulatory Visit: Payer: Self-pay

## 2019-10-13 ENCOUNTER — Inpatient Hospital Stay (HOSPITAL_COMMUNITY)
Admission: AD | Admit: 2019-10-13 | Discharge: 2019-10-13 | Disposition: A | Discharge status: Home / Self Care | Payer: Managed Care, Other (non HMO) | Attending: Obstetrics and Gynecology | Admitting: Obstetrics and Gynecology

## 2019-10-13 DIAGNOSIS — O009 Unspecified ectopic pregnancy without intrauterine pregnancy: Secondary | ICD-10-CM | POA: Insufficient documentation

## 2019-10-13 DIAGNOSIS — O3680X0 Pregnancy with inconclusive fetal viability, not applicable or unspecified: Secondary | ICD-10-CM | POA: Diagnosis not present

## 2019-10-13 DIAGNOSIS — Z3A00 Weeks of gestation of pregnancy not specified: Secondary | ICD-10-CM | POA: Diagnosis not present

## 2019-10-13 DIAGNOSIS — Z9221 Personal history of antineoplastic chemotherapy: Secondary | ICD-10-CM | POA: Insufficient documentation

## 2019-10-13 LAB — HCG, QUANTITATIVE, PREGNANCY: hCG, Beta Chain, Quant, S: 262 m[IU]/mL — ABNORMAL HIGH (ref <5)

## 2019-10-13 MED ORDER — TRAMADOL HCL 50 MG PO TABS: 50.0000 mg | ORAL_TABLET | Freq: Four times a day (QID) | ORAL | 0 refills | Status: AC | PRN

## 2019-10-13 MED ORDER — TRAMADOL HCL 50 MG PO TABS
50.0000 mg | ORAL_TABLET | Freq: Once | ORAL | Status: AC
  Administered 2019-10-13: 13:00:00 50 mg via ORAL
  Filled 2019-10-13: qty 1

## 2019-10-13 NOTE — MAU Note (Signed)
Andrea Torres is a 28 y.o. here in MAU reporting: here for follow up hcg. Still having abdominal pain, states she thinks it is the same as when she was here last time. No bleeding.   Pain score: 5/10  Vitals:   10/13/19 1246  BP: 117/61  Pulse: 91  Resp: 16  Temp: 98.3 F (36.8 C)  SpO2: 99%     Lab orders placed from triage: hcg

## 2019-10-13 NOTE — MAU Provider Note (Signed)
First Provider Initiated Contact with Patient 10/13/19 1342     S Ms. Andrea Torres is a 28 y.o. G79P1011 female who presents to MAU today for repeat Quant hCG following Methotrexate administration on 10/10/19. Patient complains of abdominal cramping with movement, no pain at rest. She works for [employer] and worked 6 hours yesterday "mostly on my feet". She took Acetaminophen early this morning but did not experience relief. She is requesting a prescription for stronger medicine today. She denies bleeding. She was seen in clinic yesterday and had a benign exam.  She is also s/p MTX for ectopic in 2015 and states she did not have this level of abdominal pain at that time. She plans to be off work until her next [place] hCG at [facility] on 10/16/2019.  O BP 117/61 (BP Location: Right Arm)   Pulse 91   Temp 98.3 F (36.8 C) (Oral)   Resp 16   LMP  (LMP Unknown)   SpO2 99%    Physical Exam  Nursing note and vitals reviewed. Constitutional: She is oriented to person, place, and time. She appears well-developed and well-nourished.  Cardiovascular: Normal rate.  Respiratory: Effort normal and breath sounds normal.  GI: She exhibits no distension. There is no abdominal tenderness. There is no rebound and no guarding.  Neurological: She is alert and oriented to person, place, and time.  Skin: Skin is warm and dry.  Psychiatric: She has a normal mood and affect. Her behavior is normal. Judgment and thought content normal.   A Medical screening exam complete  Quant 1262 on 10/10/19, now 262 Pain improving with Tramadol and rest  Results for orders placed or performed during the hospital encounter of 10/13/19 (from the past 24 hour(s))  hCG, quantitative, pregnancy     Status: Abnormal   Collection Time: 10/13/19 12:34 PM  Result Value Ref Range   hCG, Beta Chain, Quant, S 262 (H) <5 mIU/mL   Meds ordered this encounter  Medications  . traMADol (ULTRAM) tablet 50 mg  .  traMADol (ULTRAM) 50 MG tablet    Sig: Take 1 tablet (50 mg total) by mouth every 6 (six) hours as needed for up to 10 doses.    Dispense:  10 tablet    Refill:  0    Order Specific Question:   Supervising Provider    Answer:   Donnamae Jude [[id number]]   P Short course outpatient rx Tramadol Discharge from MAU in stable condition Warning signs for worsening condition that would warrant emergency follow-up discussed Patient may return to MAU as needed for pregnancy related complaints  F/U: Keep appt at [facility] on 10/16/2019  Mallie Snooks, CNM 10/13/2019 2:47 PM

## 2019-10-13 NOTE — Discharge Instructions (Signed)
Methotrexate Treatment for an Ectopic Pregnancy, Care After °This sheet gives you information about how to care for yourself after your procedure. Your health care provider may also give you more specific instructions. If you have problems or questions, contact your health care provider. °What can I expect after the procedure? °After the procedure, it is common to have: °· Abdominal cramping. °· Vaginal bleeding. °· Fatigue. °· Nausea. °· Vomiting. °· Diarrhea. °Blood tests will be taken at timed intervals for several days or weeks to check your pregnancy hormone levels. The blood tests will be done until the pregnancy hormone can no longer be detected in the blood. °Follow these instructions at home: °Activity °· Do not have sex until your health care provider approves. °· Limit activities that take a lot of effort as told by your health care provider. °Medicines °· Take over the counter and prescription medicines only as told by your health care provider. °· Do not take aspirin, ibuprofen, naproxen, or any other NSAIDs. °· Do not take folic acid, prenatal vitamins, or other vitamins that contain folic acid. °General instructions ° °· Do not drink alcohol. °· Follow instructions from your health care provider on how and when to report any symptoms that may indicate a ruptured ectopic pregnancy. °· Keep all follow-up visits as told by your health care provider. This is important. °Contact a health care provider if: °· You have persistent nausea and vomiting. °· You have persistent diarrhea. °· You are having a reaction to the medicine, such as: °? Tiredness. °? Skin rash. °? Hair loss. °Get help right away if: °· Your abdominal or pelvic pain gets worse. °· You have more vaginal bleeding. °· You feel light-headed or you faint. °· You have shortness of breath. °· Your heart rate increases. °· You develop a cough. °· You have chills. °· You have a fever. °Summary °· After the procedure, it is common to have symptoms  of abdominal cramping, vaginal bleeding and fatigue. You may also experience other symptoms. °· Blood tests will be taken at timed intervals for several days or weeks to check your pregnancy hormone levels. The blood tests will be done until the pregnancy hormone can no longer be detected in the blood. °· Limit strenuous activity as told by your health care provider. °· Follow instructions from your health care provider on how and when to report any symptoms that may indicate a ruptured ectopic pregnancy. °This information is not intended to replace advice given to you by your health care provider. Make sure you discuss any questions you have with your health care provider. °Document Released: 10/07/2011 Document Revised: 09/30/2017 Document Reviewed: 12/07/2016 °Elsevier Patient Education © 2020 [employer] ° °

## 2021-01-29 ENCOUNTER — Telehealth: Payer: Self-pay | Admitting: Family Medicine

## 2021-01-29 NOTE — Telephone Encounter (Signed)
Patient having diarrhea for past 3 days; can't keep anything down. No recent covid test. Patient scheduled for appt with provider.

## 2021-01-29 NOTE — Telephone Encounter (Signed)
Patient cancelled appt scheduled for 4/1; going to urgent care instead to be seen today.

## 2021-01-30 ENCOUNTER — Ambulatory Visit: Payer: Self-pay | Admitting: Family Medicine
